# Patient Record
Sex: Female | Born: 1940 | Race: White | Hispanic: No | Smoking: Former smoker
Health system: Southern US, Community
[De-identification: ages and names within clinical notes are randomized; demographics above are authoritative.]

## PROBLEM LIST (undated history)

## (undated) DIAGNOSIS — M1990 Unspecified osteoarthritis, unspecified site: Secondary | ICD-10-CM

## (undated) DIAGNOSIS — R079 Chest pain, unspecified: Secondary | ICD-10-CM

## (undated) DIAGNOSIS — E669 Obesity, unspecified: Secondary | ICD-10-CM

## (undated) DIAGNOSIS — E785 Hyperlipidemia, unspecified: Secondary | ICD-10-CM

## (undated) DIAGNOSIS — I10 Essential (primary) hypertension: Secondary | ICD-10-CM

## (undated) DIAGNOSIS — K219 Gastro-esophageal reflux disease without esophagitis: Secondary | ICD-10-CM

## (undated) DIAGNOSIS — F439 Reaction to severe stress, unspecified: Secondary | ICD-10-CM

## (undated) HISTORY — DX: Reaction to severe stress, unspecified: F43.9

## (undated) HISTORY — DX: Obesity, unspecified: E66.9

## (undated) HISTORY — DX: Chest pain, unspecified: R07.9

## (undated) HISTORY — PX: SHOULDER SURGERY: SHX246

## (undated) HISTORY — PX: ABDOMINAL HYSTERECTOMY: SHX81

---

## 1997-09-21 ENCOUNTER — Ambulatory Visit (HOSPITAL_COMMUNITY): Admission: RE | Admit: 1997-09-21 | Discharge: 1997-09-21 | Payer: Self-pay | Admitting: Obstetrics and Gynecology

## 1998-05-31 ENCOUNTER — Ambulatory Visit (HOSPITAL_COMMUNITY): Admission: RE | Admit: 1998-05-31 | Discharge: 1998-05-31 | Payer: Self-pay | Admitting: Obstetrics and Gynecology

## 1998-12-28 ENCOUNTER — Ambulatory Visit (HOSPITAL_COMMUNITY): Admission: RE | Admit: 1998-12-28 | Discharge: 1998-12-28 | Payer: Self-pay | Admitting: Family Medicine

## 1998-12-28 ENCOUNTER — Encounter: Payer: Self-pay | Admitting: Family Medicine

## 1999-05-16 ENCOUNTER — Other Ambulatory Visit: Admission: RE | Admit: 1999-05-16 | Discharge: 1999-05-16 | Payer: Self-pay | Admitting: Obstetrics & Gynecology

## 1999-05-29 ENCOUNTER — Ambulatory Visit (HOSPITAL_COMMUNITY): Admission: RE | Admit: 1999-05-29 | Discharge: 1999-05-29 | Payer: Self-pay | Admitting: Obstetrics and Gynecology

## 1999-05-29 ENCOUNTER — Encounter: Payer: Self-pay | Admitting: Obstetrics and Gynecology

## 1999-11-09 ENCOUNTER — Ambulatory Visit (HOSPITAL_BASED_OUTPATIENT_CLINIC_OR_DEPARTMENT_OTHER): Admission: RE | Admit: 1999-11-09 | Discharge: 1999-11-09 | Payer: Self-pay | Admitting: Orthopedic Surgery

## 2000-05-14 ENCOUNTER — Encounter: Payer: Self-pay | Admitting: Obstetrics and Gynecology

## 2000-05-14 ENCOUNTER — Ambulatory Visit (HOSPITAL_COMMUNITY): Admission: RE | Admit: 2000-05-14 | Discharge: 2000-05-14 | Payer: Self-pay | Admitting: Obstetrics and Gynecology

## 2000-05-21 ENCOUNTER — Other Ambulatory Visit: Admission: RE | Admit: 2000-05-21 | Discharge: 2000-05-21 | Payer: Self-pay | Admitting: Obstetrics and Gynecology

## 2000-11-22 ENCOUNTER — Encounter: Payer: Self-pay | Admitting: Orthopedic Surgery

## 2000-11-22 ENCOUNTER — Ambulatory Visit (HOSPITAL_COMMUNITY): Admission: RE | Admit: 2000-11-22 | Discharge: 2000-11-22 | Payer: Self-pay | Admitting: Orthopedic Surgery

## 2001-01-01 ENCOUNTER — Encounter: Payer: Self-pay | Admitting: *Deleted

## 2001-01-01 ENCOUNTER — Ambulatory Visit (HOSPITAL_COMMUNITY): Admission: RE | Admit: 2001-01-01 | Discharge: 2001-01-01 | Payer: Self-pay | Admitting: *Deleted

## 2001-01-24 ENCOUNTER — Ambulatory Visit (HOSPITAL_COMMUNITY): Admission: RE | Admit: 2001-01-24 | Discharge: 2001-01-24 | Payer: Self-pay | Admitting: Gastroenterology

## 2001-01-24 ENCOUNTER — Encounter: Payer: Self-pay | Admitting: Gastroenterology

## 2001-03-25 ENCOUNTER — Other Ambulatory Visit: Admission: RE | Admit: 2001-03-25 | Discharge: 2001-03-25 | Payer: Self-pay | Admitting: Obstetrics and Gynecology

## 2001-04-08 ENCOUNTER — Encounter: Payer: Self-pay | Admitting: Obstetrics and Gynecology

## 2001-04-08 ENCOUNTER — Ambulatory Visit (HOSPITAL_COMMUNITY): Admission: RE | Admit: 2001-04-08 | Discharge: 2001-04-08 | Payer: Self-pay | Admitting: Obstetrics and Gynecology

## 2001-12-03 ENCOUNTER — Ambulatory Visit (HOSPITAL_COMMUNITY): Admission: RE | Admit: 2001-12-03 | Discharge: 2001-12-03 | Payer: Self-pay | Admitting: Orthopedic Surgery

## 2001-12-03 ENCOUNTER — Encounter: Payer: Self-pay | Admitting: Orthopedic Surgery

## 2002-04-28 ENCOUNTER — Other Ambulatory Visit: Admission: RE | Admit: 2002-04-28 | Discharge: 2002-04-28 | Payer: Self-pay | Admitting: Obstetrics and Gynecology

## 2002-05-04 ENCOUNTER — Encounter: Payer: Self-pay | Admitting: Obstetrics and Gynecology

## 2002-05-04 ENCOUNTER — Ambulatory Visit (HOSPITAL_COMMUNITY): Admission: RE | Admit: 2002-05-04 | Discharge: 2002-05-04 | Payer: Self-pay | Admitting: Obstetrics and Gynecology

## 2002-05-04 ENCOUNTER — Ambulatory Visit (HOSPITAL_COMMUNITY): Admission: RE | Admit: 2002-05-04 | Discharge: 2002-05-04 | Payer: Self-pay

## 2003-03-05 ENCOUNTER — Emergency Department (HOSPITAL_COMMUNITY): Admission: AD | Admit: 2003-03-05 | Discharge: 2003-03-06 | Payer: Self-pay | Admitting: Emergency Medicine

## 2003-03-06 ENCOUNTER — Encounter: Payer: Self-pay | Admitting: Emergency Medicine

## 2003-06-04 ENCOUNTER — Ambulatory Visit (HOSPITAL_COMMUNITY): Admission: RE | Admit: 2003-06-04 | Discharge: 2003-06-04 | Payer: Self-pay

## 2003-06-10 ENCOUNTER — Ambulatory Visit (HOSPITAL_COMMUNITY): Admission: RE | Admit: 2003-06-10 | Discharge: 2003-06-10 | Payer: Self-pay

## 2004-07-17 ENCOUNTER — Other Ambulatory Visit: Admission: RE | Admit: 2004-07-17 | Discharge: 2004-07-17 | Payer: Self-pay | Admitting: Family Medicine

## 2004-10-09 ENCOUNTER — Encounter: Admission: RE | Admit: 2004-10-09 | Discharge: 2004-10-09 | Payer: Self-pay | Admitting: Family Medicine

## 2004-10-23 ENCOUNTER — Encounter: Admission: RE | Admit: 2004-10-23 | Discharge: 2005-01-21 | Payer: Self-pay

## 2005-03-26 ENCOUNTER — Encounter: Admission: RE | Admit: 2005-03-26 | Discharge: 2005-03-26 | Payer: Self-pay | Admitting: Family Medicine

## 2005-07-13 ENCOUNTER — Encounter: Admission: RE | Admit: 2005-07-13 | Discharge: 2005-07-13 | Payer: Self-pay | Admitting: Family Medicine

## 2005-08-02 ENCOUNTER — Ambulatory Visit: Payer: Self-pay | Admitting: Cardiology

## 2005-08-16 ENCOUNTER — Encounter: Admission: RE | Admit: 2005-08-16 | Discharge: 2005-08-16 | Payer: Self-pay | Admitting: Family Medicine

## 2007-01-20 ENCOUNTER — Inpatient Hospital Stay (HOSPITAL_COMMUNITY): Admission: EM | Admit: 2007-01-20 | Discharge: 2007-01-24 | Payer: Self-pay | Admitting: Emergency Medicine

## 2007-02-14 ENCOUNTER — Encounter: Admission: RE | Admit: 2007-02-14 | Discharge: 2007-02-14 | Payer: Self-pay | Admitting: Orthopedic Surgery

## 2007-02-24 ENCOUNTER — Encounter: Admission: RE | Admit: 2007-02-24 | Discharge: 2007-02-24 | Payer: Self-pay | Admitting: Orthopedic Surgery

## 2007-02-26 ENCOUNTER — Encounter: Admission: RE | Admit: 2007-02-26 | Discharge: 2007-02-26 | Payer: Self-pay | Admitting: Orthopedic Surgery

## 2007-03-11 ENCOUNTER — Encounter: Admission: RE | Admit: 2007-03-11 | Discharge: 2007-03-11 | Payer: Self-pay | Admitting: Radiology

## 2007-06-18 ENCOUNTER — Emergency Department (HOSPITAL_COMMUNITY): Admission: EM | Admit: 2007-06-18 | Discharge: 2007-06-18 | Payer: Self-pay | Admitting: Emergency Medicine

## 2007-07-09 ENCOUNTER — Ambulatory Visit: Payer: Self-pay | Admitting: Cardiology

## 2007-07-21 ENCOUNTER — Ambulatory Visit: Payer: Self-pay

## 2007-11-05 ENCOUNTER — Encounter: Admission: RE | Admit: 2007-11-05 | Discharge: 2007-11-05 | Payer: Self-pay | Admitting: Family Medicine

## 2008-07-30 IMAGING — CR DG HUMERUS 2V *R*
2 series · 2 of 2 positions shown · non-contrast
Comparison: none

CLINICAL DATA: Fall with right shoulder and right arm pain. 
RIGHT SHOULDER ? 2 VIEW:

[view not recorded (1 of 2)]
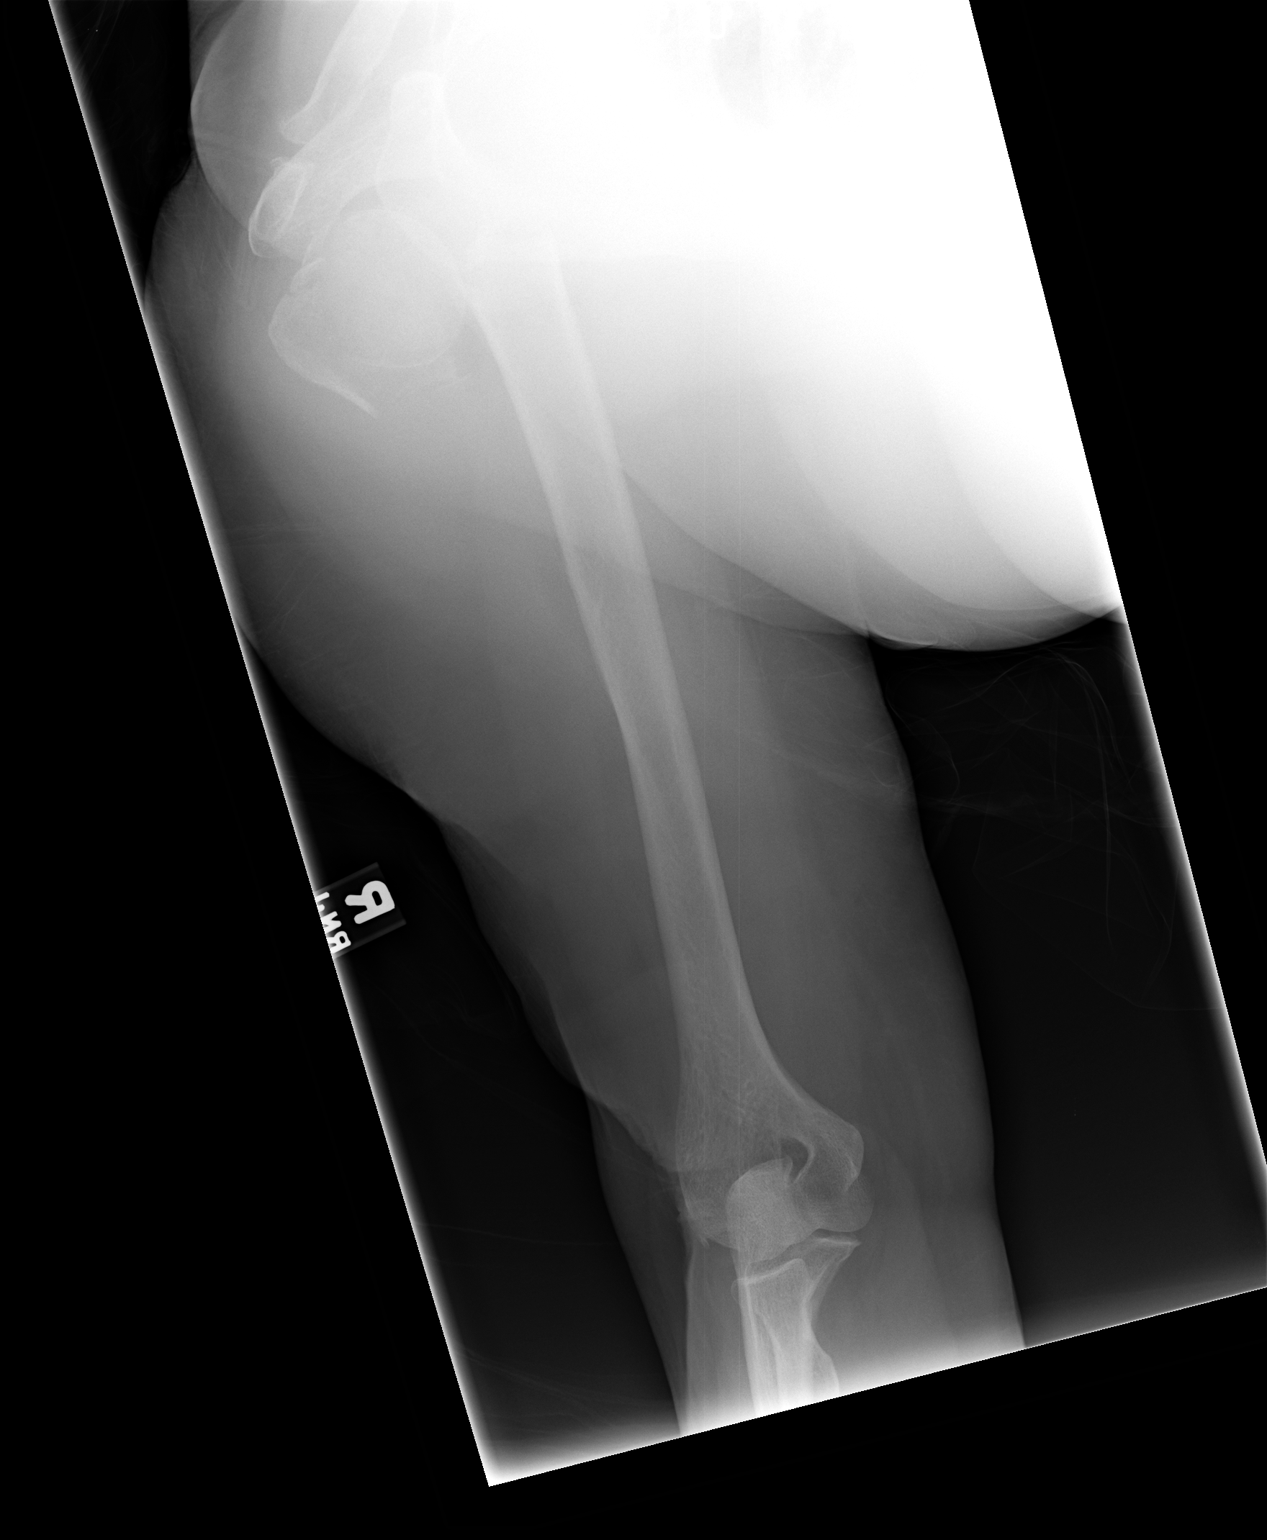

[view not recorded (2 of 2)]
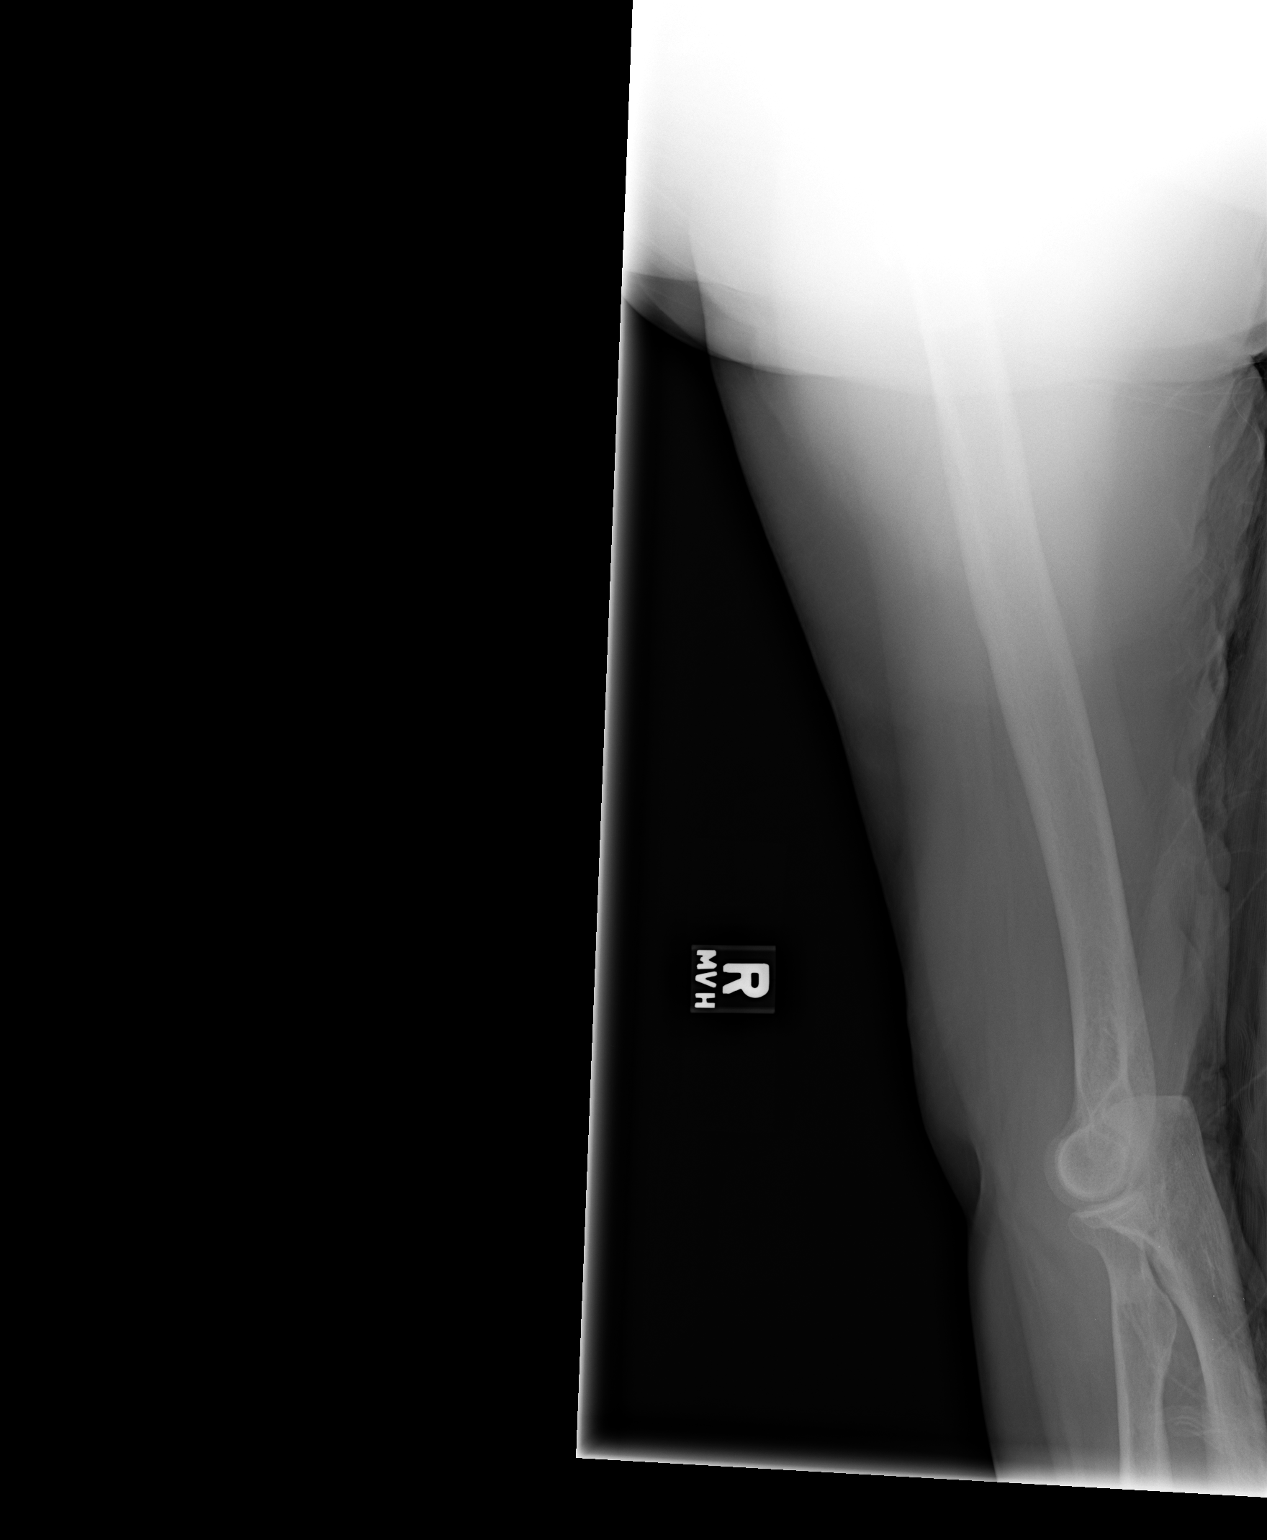

[2 of 2 positions shown; findings below may reference images not displayed]

FINDINGS: There is a 1-shaft width displaced humeral neck fracture with the distal humerus displaced inferomedially.   A fracture line does extend into the humeral head. Mild degenerative changes of the AC joint are noted.   There is no evidence of dislocation of the humeral head.
IMPRESSION: Displaced humeral neck fracture with fracture involving the humeral head.  No evidence of dislocation. 
RIGHT HUMERUS ? 2 VIEW:
FINDINGS: There is a 1-shaft width displaced humeral neck fracture with the distal humerus displaced inferomedially.   A fracture line does extend into the humeral head. Mild degenerative changes of the AC joint are noted.   There is no evidence of dislocation of the humeral head.
IMPRESSION: Displaced humeral neck fracture with fracture involving the humeral head.  No evidence of dislocation

## 2011-01-02 NOTE — Op Note (Signed)
NAMEMONNICA, SALTSMAN NO.:  [id number]   MEDICAL RECORD NO.:  [id number]          PATIENT TYPE:  INP   LOCATION:  1531                         FACILITY:  [facility]   PHYSICIAN:  Deidre Ala, M.D.    DATE OF BIRTH:  1941-05-06   DATE OF PROCEDURE:  01/22/2007  DATE OF DISCHARGE:                               OPERATIVE REPORT   PREOPERATIVE DIAGNOSIS:  Completely displaced right proximal humerus  fracture surgical neck with nondisplaced greater tuberosity fracture.   POSTOPERATIVE DIAGNOSIS:  Completely displaced right proximal humerus  fracture surgical neck with nondisplaced greater tuberosity fracture.   PROCEDURE:  Open reduction and internal fixation of right proximal  humerus displaced closed fracture with nondisplaced tuberosity fracture.   SURGEON:  1. Charlesetta Shanks, M.D.   ASSISTANT:  Phineas Semen, P.A.-C.   ANESTHESIA:  General endotracheal.   CULTURES:  None.   DRAINS:  None.   ESTIMATED BLOOD LOSS:  150 mL, replacement none.   PATHOLOGIC FINDINGS AND HISTORY:  [name] is an overweight 70 year old  female who fell sustaining this injury.  She had preoperative clearance  by Dr. Charlies Constable and, at surgery, we did a closed reduction getting a  very good initial reduction.  We then used the SNP humeral plating  system of [employer] with five smooth proximal locking pegs and  three unicortical pegs distally for the intermedullary component of the  plate.  Anatomic reduction was obtained on all views with fluoro,  intraoperative fluoro was used, with satisfactory position and  alignment.   DESCRIPTION OF PROCEDURE:  With adequate anesthesia obtained using  endotracheal technique, 1 gram Ancef given IV prophylaxis, the patient  is placed in the supine beach chair position.  She is rather large and  so it took a long time to get appropriate padding, positioning, and C-  arm positioning to get two views of the humeral head and neck.  We then  did a closed reduction maneuver and obtained a very good initial  reduction.  After standard prepping and draping, an incision was then  made lateral to the deltopectoral interval distal at the fracture level  as determined by the fluoroscopy.  Dissection was carried down to the  fracture site and retractors were placed.  I then rongeured the lateral  cortex just lateral to the intertubercular groove.  I then placed the  plate and a good position was obtained.  Two K-wires were then placed in  the humeral head to stabilize it through the appropriate holes for this  and bent to act as retractors.  I then sequentially placed the pegs with  a constellation of diverging peg throughout, adjusting length on one  because the fluoro position did show it a little bit long, but then all  screw pegs were in their appropriate position and alignment in the  subchondral bone plate, not penetrating on all views with fluoro.  Then,  I used the guide to place the three unicortical screws locking into the  plate.  The fracture was then stabilized including the greater  tuberosity.  This was a three part fracture and  there was other small  comminution present, but the overall alignment was excellent.  Irrigation was carried out. The wound was then closed in layers, on the  deltopectoral interval with 0 Vicryl running, the subcu with running 3-0  Vicryl, and the skin with staples.  A bulky sterile compressive dressing  was applied with a sling.  The patient, having tolerated the procedure  well, was awakened and taken to the recovery room in satisfactory  condition for routine postoperative care and follow-up.           ______________________________  V. Charlesetta Shanks, M.D.     VEP/MEDQ  D:  01/22/2007  T:  01/22/2007  Job:  [id number]   cc:   Hillery Aldo, M.D.   Talmadge Coventry, M.D.  Fax: [phone]   Everardo Beals. Juanda Chance, MD, [facility]  [address]  [place], [place] [address]   Lubertha Basque. Jerl Santos,  M.D.  Fax: [phone]

## 2011-01-02 NOTE — Consult Note (Signed)
[name], [name] NO.:  [id number]   MEDICAL RECORD NO.:  [id number]          PATIENT TYPE:  INP   LOCATION:  0103                         FACILITY:  [facility]   PHYSICIAN:  [name], M.D.   DATE OF BIRTH:  [birth year]   DATE OF CONSULTATION:  01/21/2007  DATE OF DISCHARGE:                                 CONSULTATION   PRIMARY CARE PHYSICIAN:  [name], M.D.   CARDIOLOGISTEverardo Beals Juanda Chance, MD, [facility]   ORTHOPEDIC SURGEON:  Nicole Basque. Jerl Edwards, M.D.   REASON FOR CONSULTATION:  Questionable inferior infarct based on the EKG  reading.   HISTORY OF PRESENT ILLNESS:  The patient is a 70 year old female with a  past medical history of hypertension and dyslipidemia who presented to  the emergency department after falling today and sustaining an injury to  the right shoulder.  Radiographs did show a fracture involving the right  shoulder, and therefore she was admitted by Dr. Jerl Edwards for surgical  repair.  A routine EKG done in the emergency department shows  questionable Q waves in the inferior leads, suggestive of a prior  inferior MI.  The patient denies any past medical history of chest pain,  anginal-type symptoms, shortness of breath, or other worrisome symptoms.  She also reports that she has had a negative stress test done by Dr.  Juanda Edwards within the last year or so.  We are asked to help provide  direction with regard to whether the patient needs further cardiac  evaluation prior to her surgery.   PAST MEDICAL HISTORY:  1. Gastroesophageal reflux disease with history of esophageal      stricture.  2. Hypertension.  3. Hyperlipidemia.  4. Osteoarthritis.  5. Hiatal hernia.  6. Status post hysterectomy.   FAMILY HISTORY:  The patient's mother died in her 19s secondary to  complications of congestive heart failure.  She also had hypertension  and hyperlipidemia.  The patient's father died at age 11 from an acute  MI.  He was diabetic.  The  patient has one sister who is alive in her  36s and is reportedly healthy.  She has one brother who is a live and is  50 and is also healthy.   SOCIAL HISTORY:  The patient is married and lives with her husband in  [place].  She has a remote history of tobacco use but quit in her  early 56s.  She denies any alcohol use.  She currently works as a Comptroller  for the elderly but has been a longstanding [employer] employee up until she  retired at age 77.   CURRENT MEDICATIONS:  The patient does not know the dosages.  1. Crestor.  2. Toprol-XL.  3. Benazepril.  4. Fish oil.  5. Protonix.  6. Lasix.  7. Tylenol.   ALLERGIES:  PROPOXYPHENE.   REVIEW OF SYSTEMS:  The patient reports that her energy level varies.  She has not gained or lost any significant amount of weight.  She denies  chest pain, shortness of breath, or other signs of angina.  She has an  occasional cough which  she attributes to her allergies.  No changes in  bowel habits, melena or hematochezia.  No nausea or vomiting.   DATA REVIEW:  Right shoulder films do show a proximal humeral fracture.   LABORATORY DATA:  White blood cell count is 11.9, hemoglobin 12.9,  hematocrit 38.9, platelets 244 with an absolute neutrophil count of  10.6.  Sodium is 139, potassium 4.0, chloride 101, bicarb 27, BUN 16,  creatinine 0.72, glucose 142.   ASSESSMENT AND PLAN:  Questionable inferior infarct based on  electrocardiogram readings:  A review of the 12-lead electrocardiogram  does show a question of Q waves in leads 2, 3, and aVF, but there is  significant artifact on this tracing so there is doubt that these are of  any clinical significance.  Given that the patient has had a negative  stress test done by Dr. Juanda Edwards within the last year and that she does  not have any symptoms suggestive of angina, I would say that the  likelihood that she has suffered an inferior myocardial infarction in  the past is very well.  Her revised [name]  cardiac risks based on her  known past medical history is 0.4%.  I would therefore proceed with  surgery with no further cardiac workup.  I would continue the patient's  perioperative beta-blocker sheath.  I would hold her benazepril and  Lasix due to fluid shifts during surgery and the risk of renal  insufficiency postoperatively and resume these 24-48 hours  postoperatively, assuming she recuperates well.  Would make sure she is  on deep venous thrombosis prophylaxis.   Thank you for this consultation.  We will follow the patient with you.      [name], M.D.  Electronically Signed     CR/MEDQ  D:  01/20/2007  T:  01/21/2007  Job:  [id number]   cc:   [name], M.D.  Fax: [phone]   Nicole Beals. Juanda Chance, MD, [facility]  [address]  [place], [place] [address]   Nicole Basque. Jerl Edwards, M.D.  Fax: [phone]

## 2011-01-02 NOTE — Assessment & Plan Note (Signed)
[facility]                            CARDIOLOGY OFFICE NOTE   JOHNNETTE, LAUX                    MRN:          [id number]  DATE:07/09/2007                            DOB:          01/04/1941    PRIMARY CARE PHYSICIAN:  Talmadge Coventry, M.D.   REFERRING PHYSICIAN:  Talmadge Coventry, M.D. and Cathren Laine, M.D.,  from the [facility] ED.   CHIEF COMPLAINT:  Chest pain.   CLINICAL HISTORY:  Ms. Buenaventura is 70 years old and was referred for  evaluation of chest pain.  She has been having substernal chest  discomfort which has been nonexertional for a few weeks and this became  worse and she called Dr. Stephannie Peters office and she advised her to go to  the emergency department.  She went to the emergency department via EMS  and was evaluated, all her markers were negative, her electrocardiogram  was okay, and she was discharged home and arrangements were made for her  to be seen in consultation here today.  She has not had very much in the  way of chest discomfort since her discharge from the emergency  department.   She has been under a great deal of stress.  She had a fractured shoulder  and medical bills of $30,000 that she is struggling to pay for.  She  just found out that she will be taking care of her 87 year old  grandchild.  The grandchild was living with her father.  This is the  daughter of Ms. Knippel's daughter who has a drug problem and was deemed  not fit to care for the child.   PAST MEDICAL HISTORY:  Significant for hypertension and hyperlipidemia.  She also has GERD and osteoarthritis.   CURRENT MEDICATIONS:  Include Lasix, K-Dur, Vicodin, Lovaza, aspirin,  simvastatin, Toprol-XL, and benazepril.   SOCIAL HISTORY:  She is married.  She lost two previous husbands by  cancer and I believe this is her fourth marriage.  She has a total of  four children by her first husband and one child by her second-to-last  husband who  is the daughter whose granddaughter will be staying with  her.  She was working part-time sitting with elderly people but has not  worked since she had her injury and fracture of her humerus.   FAMILY HISTORY:  Positive for heart disease.  Her father died of a heart  attack at age 72 and her mother died at age 27 of congestive heart  failure.   REVIEW OF SYSTEMS:  Positive for symptoms of stress and fatigue.   EXAMINATION:  The blood pressure was 150/80 and the pulse 93 and  regular.  There was no venous distention.  The carotid pulses were full  and there were no bruits.  The chest was clear without rales or rhonchi.  The cardiac rhythm was regular.  I could hear no murmurs or gallops.  The abdomen was soft with normal bowel sounds.  The abdomen was  protuberant.  There was no hepatosplenomegaly.  Peripheral pulses were  full and there was no peripheral edema.  An electrocardiogram showed sinus rhythm at 93 and possible old inferior  infarction.   IMPRESSION:  1. Chest pain, rule out ischemic heart disease.  2. Hypertension.  3. Hyperlipidemia.  4. Situational stress.  5. Status post surgery for a fractured humerus June 2008.   RECOMMENDATIONS:  Ms. Lewison symptoms are somewhat atypical for  ischemia but her risk profile is fairly high.  I think we should  evaluate her further and we have arranged for her to have a rest/stress  adenosine Myoview scan.  Her blood pressure and pulse are also somewhat  up and will increase her Toprol-XL from 100 to 150 a day.  If her scan  does not show ischemia then I would attribute many of her symptoms to  stress.  Dr. Smith Mince is following her for her other medical problems  and she plans follow up with her later.  We will be in touch with her  after we get the results of her scan.     Bruce Elvera Lennox Juanda Chance, MD, [facility]  Electronically Signed    BRB/MedQ  DD: 07/09/2007  DT: 07/09/2007  Job #: [id number]

## 2011-01-02 NOTE — Discharge Summary (Signed)
[name], [name] NO.:  [id number]   MEDICAL RECORD NO.:  [id number]          PATIENT TYPE:  INP   LOCATION:  1531                         FACILITY:  [facility]   PHYSICIAN:  Deidre Ala, M.D.    DATE OF BIRTH:  Mar 03, 1941   DATE OF ADMISSION:  01/20/2007  DATE OF DISCHARGE:  01/24/2007                               DISCHARGE SUMMARY   FINAL DIAGNOSES:  1. Right humeral surgical neck fracture.  2. Gastroesophageal reflux.  3. Hiatal hernia.  4. Hypertension.  5. Morbid obesity.  6. Osteoarthritis.  7. Hyperlipidemia.   HISTORY:  This is a 70 year old white female who apparently had fallen  the other day hitting her right side.   PAST MEDICAL HISTORY:  1. Hypertension.  2. Dyslipidemia.   She presented to the Emergency Room on January 20, 2007.  A workup was done  which showed a surgical neck fracture of right humerus.  Subsequently,  initially seen by Dr. Jerl Santos and admitted and then transferred to Dr.  Ollen Gross service.   HOSPITAL COURSE:  At admission the patient was noted to have the  surgical neck fracture; subsequently, she was scheduled for surgery.  On  January 22, 2007, she underwent ORIF of the right surgical neck of the  humerus.  She tolerated procedure well, no intraoperative complications  occurred.  Postoperatively, the patient's recovery was excellent.  No  untoward events occurred during her stay.  She did have some mild acute  blood loss anemia postoperatively, this was stable at the time of  discharge with the hemoglobin being 9.1 and hematocrit 27.8.  Overall,  she is doing well.  The surgical incision was healing satisfactorily.  She had the staples sutured on the right upper arm.  No sign of  infection was noted at the time of discharge.  Peripheral pulses intact,  neuro is grossly intact.  She was ready for discharge on January 24, 2007.  Only complaint was not having a bowel movement.  Prior to her discharge,  she had a bowel movement.  She  was given laxative of choice to  accomplish this.   At the time of discharge the patient's medications were:  1. Crestor daily.  2. Toprol-XL daily.  3. Benazepril.  4. Fish Oil.  5. Protonix.  6. Lasix.  7. Vicodin 1-2 p.o. q.4-6h. p.r.n. for pain.   Overall, patient's recovered without difficulty.  She was discharged  home in satisfactory and stable condition on January 24, 2007.  She is to  followup with Dr. Renae Fickle in 10 days.      Phineas Semen, P.A.    ______________________________  Seth [place]. Charlesetta Shanks, M.D.    CL/MEDQ  D:  01/24/2007  T:  01/24/2007  Job:  [id number]

## 2011-01-05 NOTE — Procedures (Signed)
[place]  Patient:    [name], [name]                      MRN: [id number] Proc. Date: 01/24/01 Attending:  Verlin Grills, M.D. CC:         Jethro Bastos, M.D.                           Procedure Report  PROCEDURE:  Esophagogastroduodenoscopy with Savary esophageal dilation.  ENDOSCOPIST:  Verlin Grills, M.D.  INDICATIONS:  [name]. 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  She is experiencing dysphagia. She is scheduled for esophageal dilation.  PREMEDICATION:  Versed 10 mg, fentanyl 100 mcg.  ENDOSCOPE:  Olympus gastroscope, 16 mm Savary dilator.  DESCRIPTION OF PROCEDURE:  After obtaining informed consent, [name] was placed in the left lateral decubitus position on the fluoroscopy table.  I administered intravenous fentanyl and intravenous Versed to achieve conscious sedation for the procedure.  The patients blood pressure, oxygen saturation and cardiac rhythm were monitored throughout the procedure and documented in the medical record.  The Olympus gastroscope was passed through the posterior hypopharynx into the proximal esophagus without difficulty.  The hypopharynx, larynx and vocal cords were normal.  Esophagoscopy:  The proximal and mid segments of the esophagus appeared normal.  There is a benign peptic stricture at the esophagogastric junction. There is no endoscopic evidence for the presence of erosive esophagitis, Barretts esophagus or esophageal ulceration.  Gastroscopy:  Retroflexed view of the gastric cardia and fundus was normal. The gastric body, antrum and pylorus appeared normal.  Duodenoscopy:  The duodenal bulb and descending duodenum appeared normal.  Savary esophageal dilation:  The Savary dilator wire was passed through the endoscope and tip of the  guidewire advanced to the distal gastric antrum as confirmed endoscopically and fluoroscopically.  Under fluoroscopic guidance, the 16 mm Savary dilator passed without resistance.  Repeat esophagogastroscopy post esophageal dilation revealed satisfactory dilation of the benign peptic stricture at the esophagogastric junction, and no endoscopic evidence present of trauma due to the guidewire.  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  RECOMMENDATIONS: 1. Continue proton pump inhibitor therapy to prevent heartburn. 2. Repeat esophageal dilation as needed. DD:  01/24/01 TD:  01/24/01 Job: [id number]

## 2011-01-05 NOTE — Op Note (Signed)
[place]  Patient:    [name], [name]                   MRN: [id number] Proc. Date: 11/09/99 Attending:  Jearld Adjutant, M.D. CC:         Jearld Adjutant, M.D.                           Operative Report  PREOPERATIVE DIAGNOSES: 1. Right fourth hammertoe. 2. Right fifth hard corn dorsolateral DIP joint. 3. Right fifth hammertoe. 4. Right fifth bunionette at metatarsal head. 5. Left fourth hammertoe. 6. Left fifth DIP dorsolateral corn - hard corn. 7. Left fifth hammertoe. 8. Left fifth bunionette.  POSTOPERATIVE DIAGNOSES: 1. Right fourth hammertoe. 2. Right fifth hard corn dorsolateral DIP joint. 3. Right fifth hammertoe. 4. Right fifth bunionette at metatarsal head. 5. Left fourth hammertoe. 6. Left fifth DIP dorsolateral corn - hard corn. 7. Left fifth hammertoe. 8. Left fifth bunionette.  PROCEDURE: 1. Right fourth DuVries hammertoe correction. 2. Right fifth DIP joint partial phalangeal base excision. 3. Right fifth DuVries hammertoe correction at PIP joint. 4. Right fifth bunionette excision. 5. Left fourth DuVries PIP hammertoe correction. 6. Left fourth phalangeal base partial _________ , DIP joint. 7. Left fifth DuVries hammertoe PIP correction. 8. Left fifth bunionette excision.  SURGEON:  Jearld Adjutant, M.D.  ASSISTANT:  Currie Paris. Thedore Mins.  ANESTHESIA:  General endotracheal.  CULTURES:  None.  DRAINS:  None.  ESTIMATED BLOOD LOSS:  Minimal.  TOURNIQUET TIME:  Right, 51 minutes, left 50 minutes.  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  She had good circulation of the feet.  She was miserable with her shoes, very painful with _________ over the corns with hammertoes and also painful bunionettes bilaterally. These were rubbing, hurting, producing callus, and she wanted corrections done s well  as excision of the corn areas and straightening of the fourth and fifth toes and the excision of the bunionettes.  Therefore this is what we did with good corrections all obtained with removal of the bony hard condyle producing the fifth toe corns bilaterally.  The DuVries worked well to straighten the curled toes with joint resection as will be listed and the bunionette excisions decompressed the  fifth metatarsal heads bilaterally.  PROCEDURE:  With adequate anesthesia obtained using endotracheal technique, 1 g  Ancef given, IV prophylaxis, the patient was placed in the supine position. Both feet were prepped from the toes to the knee in the standard fashion.  After standard prepping and draping, we started on the right side, where Esmarch sanguinous was used and the Esmarch left on above the ankle.  The tourniquet was left at 350 mmHg.  I first ellipsed out the corn soft tissue over the right fifth toe DIP joint, dorsolateral, excised the soft tissue corn, then dissected down o the phalangeal base and removed it with a Rongeurs smoothing it.  This was irrigated and then closed with 4-0 nylon, everting the skin.  I then turned attention to the right fifth bunionette, where a curvilinear dorsal incision was made over the metatarsal head.  Dissection was carried down to the capsule which was taken as a flap based distal.  I then exposed the exostosis of the bunionette and removed it with a saw and oscillating rasp.  We then repaired the capsule over top and the skin with 4-0 nylon.  I then turned attention to the right fourth toe where a dorsal wedge of skin was taken out over the PIP joint, the extensor tendon. The joint was then resected in V-shaped fashion with a Rongeur and the flexor tendon released.  I also did a tenotomy on the extensor tendon to keep it from cocking up at the MPP joint.  We then sutured this back, straightening the PIP joint over 2-0 flow bolsters in  the manner of DuVries and we straightened these  toes from their excessive curling and sutured this with vertical mattress suture of 4-0 nylon and sutures on either side of the PIP joint skin resection also.  I was careful to not leave any dog ears on the skin so that no scar tissue would form, producing additional corns.  I then performed the exact same procedure on the fifth toe on the right.  At this point all wounds were then closed with 4-0 nylon.  A  bulky sterile compressive forefoot dressing was done and placed on and the tourniquet let down with the toes pinking up nicely.  I then used Esmarch for sanguination on the left foot, Esmarch above the ankle, and performed the exact  same procedures as listed on the right with the corn excision left fifth DIP, the bunionette left fifth metatarsal head, and the DuVries on the left fourth and fifth PIP joints with straightening over the bolsters and an extensor tenotomy of 4.  Similar dressing was applied.  Toes pinked up well.  The patient, having tolerated the procedure well, was awake and taken to the recovery room in satisfactory condition to be discharged to outpatient routine, crutches or walker, weight-bearing as tolerated on wooden sole shoes on her heel, elevation, Tylox or pain.  We told her to call the office for recheck on Monday.  That is in four days from now.  I also did trim her toenails. DD:  11/09/99 TD:  11/09/99 Job: [id number]

## 2011-05-30 LAB — I-STAT 8, (EC8 V) (CONVERTED LAB)
Acid-Base Excess: 3 — ABNORMAL HIGH
BUN: 26 — ABNORMAL HIGH
Bicarbonate: 27.4 — ABNORMAL HIGH
Chloride: 110
Glucose, Bld: 163 — ABNORMAL HIGH
HCT: 47 — ABNORMAL HIGH
Hemoglobin: 16 — ABNORMAL HIGH
Operator id: 198171
Potassium: 5.1
Sodium: 138
TCO2: 29
pCO2, Ven: 41.3 — ABNORMAL LOW
pH, Ven: 7.43 — ABNORMAL HIGH

## 2011-05-30 LAB — POCT I-STAT CREATININE
Creatinine, Ser: 0.8
Operator id: 198171

## 2011-05-30 LAB — POCT CARDIAC MARKERS
CKMB, poc: 1.8
Myoglobin, poc: 79.5
Operator id: 198171
Troponin i, poc: <0.05

## 2011-06-07 LAB — URINALYSIS, ROUTINE W REFLEX MICROSCOPIC
Bilirubin Urine: NEGATIVE
Glucose, UA: NEGATIVE
Hgb urine dipstick: NEGATIVE
Ketones, ur: NEGATIVE
Nitrite: NEGATIVE
Protein, ur: NEGATIVE
Specific Gravity, Urine: 1.015
Urobilinogen, UA: 0.2
pH: 6

## 2011-06-07 LAB — CBC
HCT: 27.8 — ABNORMAL LOW
HCT: 38.9
Hemoglobin: 12.9
Hemoglobin: 9.6 — ABNORMAL LOW
MCHC: 32.9
MCHC: 33.2
MCHC: 33.6
MCV: 91.6
MCV: 91.6
MCV: 93.5
Platelets: 244
RBC: 2.97 — ABNORMAL LOW
RBC: 3.13 — ABNORMAL LOW
RBC: 4.25
RDW: 13.7
WBC: 11.9 — ABNORMAL HIGH
WBC: 8.6
WBC: 9.1

## 2011-06-07 LAB — BASIC METABOLIC PANEL
BUN: 16
CO2: 27
Calcium: 9.2
Chloride: 101
Creatinine, Ser: 0.72
GFR calc Af Amer: >60
GFR calc non Af Amer: >60
Glucose, Bld: 142 — ABNORMAL HIGH
Potassium: 4
Sodium: 139

## 2011-06-07 LAB — PROTIME-INR
INR: 1.1
Prothrombin Time: 14.8

## 2011-06-07 LAB — DIFFERENTIAL
Basophils Absolute: 0
Basophils Relative: 0
Eosinophils Absolute: 0
Eosinophils Relative: 0
Lymphocytes Relative: 9 — ABNORMAL LOW
Lymphs Abs: 1.1
Monocytes Absolute: 0.2
Monocytes Relative: 1 — ABNORMAL LOW
Neutro Abs: 10.6 — ABNORMAL HIGH
Neutrophils Relative %: 89 — ABNORMAL HIGH

## 2011-07-14 ENCOUNTER — Encounter: Payer: Self-pay | Admitting: Emergency Medicine

## 2011-07-14 ENCOUNTER — Other Ambulatory Visit: Payer: Self-pay

## 2011-07-14 ENCOUNTER — Emergency Department (HOSPITAL_COMMUNITY)
Admission: EM | Admit: 2011-07-14 | Discharge: 2011-07-14 | Disposition: A | Discharge status: Home / Self Care | Payer: Medicare Other | Attending: Emergency Medicine | Admitting: Emergency Medicine

## 2011-07-14 ENCOUNTER — Emergency Department (HOSPITAL_COMMUNITY): Payer: Medicare Other

## 2011-07-14 DIAGNOSIS — Z8739 Personal history of other diseases of the musculoskeletal system and connective tissue: Secondary | ICD-10-CM | POA: Insufficient documentation

## 2011-07-14 DIAGNOSIS — R079 Chest pain, unspecified: Secondary | ICD-10-CM | POA: Insufficient documentation

## 2011-07-14 DIAGNOSIS — Z79899 Other long term (current) drug therapy: Secondary | ICD-10-CM | POA: Insufficient documentation

## 2011-07-14 DIAGNOSIS — R1310 Dysphagia, unspecified: Secondary | ICD-10-CM | POA: Insufficient documentation

## 2011-07-14 DIAGNOSIS — K219 Gastro-esophageal reflux disease without esophagitis: Secondary | ICD-10-CM | POA: Insufficient documentation

## 2011-07-14 DIAGNOSIS — E785 Hyperlipidemia, unspecified: Secondary | ICD-10-CM | POA: Insufficient documentation

## 2011-07-14 DIAGNOSIS — E119 Type 2 diabetes mellitus without complications: Secondary | ICD-10-CM | POA: Insufficient documentation

## 2011-07-14 DIAGNOSIS — I10 Essential (primary) hypertension: Secondary | ICD-10-CM | POA: Insufficient documentation

## 2011-07-14 HISTORY — DX: Gastro-esophageal reflux disease without esophagitis: K21.9

## 2011-07-14 HISTORY — DX: Hyperlipidemia, unspecified: E78.5

## 2011-07-14 HISTORY — DX: Essential (primary) hypertension: I10

## 2011-07-14 HISTORY — DX: Unspecified osteoarthritis, unspecified site: M19.90

## 2011-07-14 NOTE — ED Provider Notes (Signed)
History     CSN: [id number] Arrival date & time: 07/14/2011  1:38 PM   First MD Initiated Contact with Patient 07/14/11 1433      Chief Complaint  Patient presents with  . Hypertension  . Chest Pain    due to coughing when she got choked    (Consider location/radiation/quality/duration/timing/severity/associated sxs/prior treatment) Patient is a 70 y.o. female presenting with chest pain. The history is provided by the patient.  Chest Pain The chest pain began 3 - 5 hours ago. Episode Length: She took a pill this morning that she feels became lodged in her throat or upper chest. She felt like she was choking, and continued to drink water until the pill dissolved. She complained of chest pain with the coughing episode, but no further pain now.  Progression since onset: She reports that she became concerned about her blood pressure when it was found to be high by EMS.  Pertinent negatives for primary symptoms include no fever, no syncope, no shortness of breath, no cough and no abdominal pain.     Past Medical History  Diagnosis Date  . Hypertension   . GERD (gastroesophageal reflux disease)   . Diabetes mellitus   . Hyperlipidemia   . Arthritis     No past surgical history on file.  No family history on file.  History  Substance Use Topics  . Smoking status: Not on file  . Smokeless tobacco: Not on file  . Alcohol Use: No    OB History    Grav Para Term Preterm Abortions TAB SAB Ect Mult Living                  Review of Systems  Constitutional: Negative for fever and chills.  HENT:       See HPI.  Respiratory: Negative.  Negative for cough and shortness of breath.   Cardiovascular: Positive for chest pain. Negative for syncope.       See HPI.  Gastrointestinal: Negative.  Negative for abdominal pain.  Musculoskeletal: Negative.   Skin: Negative.   Neurological: Negative.     Allergies  Review of patient's allergies indicates no known allergies.  Home  Medications   Current Outpatient Rx  Name Route Sig Dispense Refill  . BENAZEPRIL HCL 40 MG PO TABS Oral Take 40 mg by mouth daily.      Marland Kitchen VITAMIN D 1000 UNITS PO TABS Oral Take 1,000 Units by mouth daily.      Marland Kitchen FLUTICASONE PROPIONATE 50 MCG/ACT NA SUSP Nasal Place 2 sprays into the nose 2 (two) times daily as needed. allergies     . FUROSEMIDE 20 MG PO TABS Oral Take 20 mg by mouth 2 (two) times daily. fluid    . GLIMEPIRIDE 4 MG PO TABS Oral Take 4 mg by mouth daily before breakfast.      . METOPROLOL SUCCINATE 100 MG PO TB24 Oral Take 150 mg by mouth daily. Pt takes 1.5 tab ( 150mg  dose)    . OMEPRAZOLE 20 MG PO CPDR Oral Take 20 mg by mouth 2 (two) times daily.      Marland Kitchen POTASSIUM CHLORIDE CRYS CR 20 MEQ PO TBCR Oral Take 20 mEq by mouth 2 (two) times daily.      Marland Kitchen SIMVASTATIN 40 MG PO TABS Oral Take 40 mg by mouth at bedtime.      . SULFAMETHOXAZOLE-TMP DS 800-160 MG PO TABS Oral Take 1 tablet by mouth 2 (two) times daily. Filled 07-05-11 for 10  day supply. Pt is on day 6th of therapy      BP 158/76  Pulse 102  Temp(Src) 97.6 F (36.4 C) (Oral)  Resp 18  Ht 5\' 5"  (1.651 m)  Wt 212 lb (96.163 kg)  BMI 35.28 kg/m2  SpO2 98%  Physical Exam  Constitutional: She appears well-developed and well-nourished.  HENT:  Head: Normocephalic.  Neck: Normal range of motion. Neck supple.  Cardiovascular: Normal rate and regular rhythm.   Pulmonary/Chest: Effort normal and breath sounds normal. She exhibits no tenderness.  Abdominal: Soft. Bowel sounds are normal. There is no tenderness. There is no rebound and no guarding.  Musculoskeletal: Normal range of motion.  Neurological: She is alert. No cranial nerve deficit.  Skin: Skin is warm and dry. No rash noted.  Psychiatric: She has a normal mood and affect.    ED Course  Procedures (including critical care time)  Labs Reviewed - No data to display No results found.   No diagnosis found.    MDM  The patient is eating and  drinking without difficulty. Chest x-ray is normal. Her blood pressure is now significantly lower with symptoms of lightheadedness and mild nausea. She reports she hasn't eaten or checked her blood sugar. Will give meal and check CBG and re-evaluate.  Patient feeling better, CBG 139. States ready for discharge home.        Rodena Medin, PA 07/14/11 1726

## 2011-07-14 NOTE — ED Provider Notes (Signed)
Medical screening examination/treatment/procedure(s) were performed by non-physician practitioner and as supervising physician I was immediately available for consultation/collaboration.   Geoffery Lyons, MD 07/14/11 2030

## 2011-07-14 NOTE — ED Notes (Signed)
NWG:[id number]<ZH> Expected date:07/14/11<BR> Expected time: 1:16 PM<BR> Means of arrival:Ambulance<BR> Comments:<BR> EMS 5 RK  70 yof Chest wall pain after coughing  ETA 15

## 2012-03-30 ENCOUNTER — Observation Stay (HOSPITAL_COMMUNITY)
Admission: EM | Admit: 2012-03-30 | Discharge: 2012-03-31 | Disposition: A | Discharge status: Home / Self Care | Payer: Medicare Other | Attending: Internal Medicine | Admitting: Internal Medicine

## 2012-03-30 ENCOUNTER — Encounter (HOSPITAL_COMMUNITY): Payer: Self-pay | Admitting: Emergency Medicine

## 2012-03-30 ENCOUNTER — Emergency Department (HOSPITAL_COMMUNITY): Payer: Medicare Other

## 2012-03-30 DIAGNOSIS — R739 Hyperglycemia, unspecified: Secondary | ICD-10-CM

## 2012-03-30 DIAGNOSIS — E118 Type 2 diabetes mellitus with unspecified complications: Secondary | ICD-10-CM

## 2012-03-30 DIAGNOSIS — E669 Obesity, unspecified: Secondary | ICD-10-CM | POA: Insufficient documentation

## 2012-03-30 DIAGNOSIS — E785 Hyperlipidemia, unspecified: Secondary | ICD-10-CM | POA: Diagnosis present

## 2012-03-30 DIAGNOSIS — M1990 Unspecified osteoarthritis, unspecified site: Secondary | ICD-10-CM | POA: Diagnosis present

## 2012-03-30 DIAGNOSIS — R079 Chest pain, unspecified: Secondary | ICD-10-CM

## 2012-03-30 DIAGNOSIS — E119 Type 2 diabetes mellitus without complications: Secondary | ICD-10-CM | POA: Insufficient documentation

## 2012-03-30 DIAGNOSIS — IMO0002 Reserved for concepts with insufficient information to code with codable children: Secondary | ICD-10-CM

## 2012-03-30 DIAGNOSIS — K219 Gastro-esophageal reflux disease without esophagitis: Secondary | ICD-10-CM

## 2012-03-30 DIAGNOSIS — E1165 Type 2 diabetes mellitus with hyperglycemia: Secondary | ICD-10-CM | POA: Diagnosis present

## 2012-03-30 DIAGNOSIS — R0789 Other chest pain: Principal | ICD-10-CM

## 2012-03-30 DIAGNOSIS — I10 Essential (primary) hypertension: Secondary | ICD-10-CM

## 2012-03-30 DIAGNOSIS — M129 Arthropathy, unspecified: Secondary | ICD-10-CM | POA: Insufficient documentation

## 2012-03-30 LAB — CARDIAC PANEL(CRET KIN+CKTOT+MB+TROPI)
CK, MB: 3.8 ng/mL (ref 0.3–4.0)
Troponin I: <0.3 ng/mL (ref <0.30)
Troponin I: <0.3 ng/mL (ref <0.30)

## 2012-03-30 LAB — HEMOGLOBIN A1C
Hgb A1c MFr Bld: 7.5 % — ABNORMAL HIGH (ref <5.7)
Mean Plasma Glucose: 169 mg/dL — ABNORMAL HIGH (ref <117)

## 2012-03-30 LAB — BASIC METABOLIC PANEL
BUN: 17 mg/dL (ref 6–23)
CO2: 26 mEq/L (ref 19–32)
Calcium: 9.2 mg/dL (ref 8.4–10.5)
Creatinine, Ser: 0.88 mg/dL (ref 0.50–1.10)
GFR calc non Af Amer: 65 mL/min — ABNORMAL LOW (ref >90)
Glucose, Bld: 186 mg/dL — ABNORMAL HIGH (ref 70–99)

## 2012-03-30 LAB — CBC WITH DIFFERENTIAL/PLATELET
Eosinophils Absolute: 0.1 10*3/uL (ref 0.0–0.7)
Eosinophils Relative: 2 % (ref 0–5)
HCT: 36.6 % (ref 36.0–46.0)
Lymphocytes Relative: 24 % (ref 12–46)
Lymphs Abs: 1.6 10*3/uL (ref 0.7–4.0)
MCH: 31.4 pg (ref 26.0–34.0)
MCV: 95.8 fL (ref 78.0–100.0)
Monocytes Absolute: 0.8 10*3/uL (ref 0.1–1.0)
Monocytes Relative: 12 % (ref 3–12)
RBC: 3.82 MIL/uL — ABNORMAL LOW (ref 3.87–5.11)
WBC: 6.6 10*3/uL (ref 4.0–10.5)

## 2012-03-30 LAB — GLUCOSE, CAPILLARY

## 2012-03-30 LAB — D-DIMER, QUANTITATIVE: D-Dimer, Quant: 0.46 ug/mL-FEU (ref 0.00–0.48)

## 2012-03-30 MED ORDER — ONDANSETRON HCL 4 MG PO TABS: 4.0000 mg | ORAL_TABLET | Freq: Four times a day (QID) | ORAL | Status: DC | PRN

## 2012-03-30 MED ORDER — ONDANSETRON HCL 4 MG/2ML IJ SOLN
4.0000 mg | Freq: Four times a day (QID) | INTRAMUSCULAR | Status: DC | PRN
  Administered 2012-03-30: 4 mg via INTRAVENOUS
  Filled 2012-03-30: qty 2

## 2012-03-30 MED ORDER — NITROGLYCERIN 0.4 MG SL SUBL: 0.4000 mg | SUBLINGUAL_TABLET | SUBLINGUAL | Status: DC | PRN

## 2012-03-30 MED ORDER — FLUTICASONE PROPIONATE 50 MCG/ACT NA SUSP
2.0000 | Freq: Two times a day (BID) | NASAL | Status: DC | PRN
  Filled 2012-03-30: qty 16

## 2012-03-30 MED ORDER — METOPROLOL SUCCINATE ER 50 MG PO TB24
150.0000 mg | ORAL_TABLET | Freq: Every day | ORAL | Status: DC
  Administered 2012-03-30 – 2012-03-31 (×2): 150 mg via ORAL
  Filled 2012-03-30 (×3): qty 1

## 2012-03-30 MED ORDER — INSULIN ASPART 100 UNIT/ML ~~LOC~~ SOLN
0.0000 [IU] | Freq: Three times a day (TID) | SUBCUTANEOUS | Status: DC
  Administered 2012-03-30 (×2): 2 [IU] via SUBCUTANEOUS
  Administered 2012-03-31 (×2): 1 [IU] via SUBCUTANEOUS

## 2012-03-30 MED ORDER — ACETAMINOPHEN 650 MG RE SUPP: 650.0000 mg | Freq: Four times a day (QID) | RECTAL | Status: DC | PRN

## 2012-03-30 MED ORDER — POTASSIUM CHLORIDE CRYS ER 20 MEQ PO TBCR
20.0000 meq | EXTENDED_RELEASE_TABLET | Freq: Two times a day (BID) | ORAL | Status: DC
  Administered 2012-03-30 – 2012-03-31 (×2): 20 meq via ORAL
  Filled 2012-03-30 (×4): qty 1

## 2012-03-30 MED ORDER — ASPIRIN EC 325 MG PO TBEC
325.0000 mg | DELAYED_RELEASE_TABLET | Freq: Every day | ORAL | Status: DC
  Administered 2012-03-30 – 2012-03-31 (×2): 325 mg via ORAL
  Filled 2012-03-30 (×2): qty 1

## 2012-03-30 MED ORDER — ENOXAPARIN SODIUM 40 MG/0.4ML ~~LOC~~ SOLN
40.0000 mg | SUBCUTANEOUS | Status: DC
  Administered 2012-03-30 – 2012-03-31 (×2): 40 mg via SUBCUTANEOUS
  Filled 2012-03-30 (×3): qty 0.4

## 2012-03-30 MED ORDER — NITROGLYCERIN 2 % TD OINT
0.5000 [in_us] | TOPICAL_OINTMENT | Freq: Four times a day (QID) | TRANSDERMAL | Status: DC
  Administered 2012-03-30: 0.5 [in_us] via TOPICAL
  Filled 2012-03-30: qty 1

## 2012-03-30 MED ORDER — GLIMEPIRIDE 4 MG PO TABS
4.0000 mg | ORAL_TABLET | Freq: Every day | ORAL | Status: DC
  Administered 2012-03-30 – 2012-03-31 (×2): 4 mg via ORAL
  Filled 2012-03-30 (×4): qty 1

## 2012-03-30 MED ORDER — FUROSEMIDE 20 MG PO TABS
20.0000 mg | ORAL_TABLET | Freq: Every day | ORAL | Status: DC
  Administered 2012-03-30 – 2012-03-31 (×2): 20 mg via ORAL
  Filled 2012-03-30 (×2): qty 1

## 2012-03-30 MED ORDER — VITAMIN D3 25 MCG (1000 UNIT) PO TABS
1000.0000 [IU] | ORAL_TABLET | Freq: Every day | ORAL | Status: DC
  Administered 2012-03-30 – 2012-03-31 (×2): 1000 [IU] via ORAL
  Filled 2012-03-30 (×3): qty 1

## 2012-03-30 MED ORDER — ALUM & MAG HYDROXIDE-SIMETH 200-200-20 MG/5ML PO SUSP: 30.0000 mL | Freq: Four times a day (QID) | ORAL | Status: DC | PRN

## 2012-03-30 MED ORDER — PANTOPRAZOLE SODIUM 40 MG PO TBEC
40.0000 mg | DELAYED_RELEASE_TABLET | Freq: Every day | ORAL | Status: DC
  Administered 2012-03-30 – 2012-03-31 (×2): 40 mg via ORAL
  Filled 2012-03-30 (×2): qty 1

## 2012-03-30 MED ORDER — BENAZEPRIL HCL 40 MG PO TABS
40.0000 mg | ORAL_TABLET | Freq: Every day | ORAL | Status: DC
  Administered 2012-03-30 – 2012-03-31 (×2): 40 mg via ORAL
  Filled 2012-03-30 (×3): qty 1

## 2012-03-30 MED ORDER — FENOFIBRATE 160 MG PO TABS
160.0000 mg | ORAL_TABLET | Freq: Every day | ORAL | Status: DC
  Administered 2012-03-30 – 2012-03-31 (×2): 160 mg via ORAL
  Filled 2012-03-30 (×3): qty 1

## 2012-03-30 MED ORDER — ACETAMINOPHEN 325 MG PO TABS
650.0000 mg | ORAL_TABLET | Freq: Four times a day (QID) | ORAL | Status: DC | PRN
  Administered 2012-03-30 – 2012-03-31 (×3): 650 mg via ORAL
  Filled 2012-03-30 (×2): qty 2

## 2012-03-30 NOTE — ED Notes (Signed)
PT was given 2 ASA and 1 nitro SL - Pain was at a 5/10 now 3/10

## 2012-03-30 NOTE — ED Notes (Signed)
MD at bedside. Dr. Jeanie Sewer

## 2012-03-30 NOTE — H&P (Signed)
Patient's PCP: Eartha Inch, MD  Chief Complaint: Chest pain  History of Present Illness: [name] is a 71 y.o. Caucasian female with history of hypertension, GERD, diabetes, hyperlipidemia, and arthritis who presents with the above complaints.  Patient reports that over the last week she's been having some intermittent left-sided chest pains.  Chest pain is not associated with activity.  Pain is worse laying down.  Last night she had pain that went to the back and to the neck as a result she presented to the emergency department for further evaluation.  Patient was given nitroglycerin by the EMS and her pain had improved.  She feels like there is gas around the heart to is causing her pain.  She denies any recent fevers, chills, nausea or vomiting.  Denies any shortness of breath.  Denies any abdominal pain or diarrhea.  Chronically has daily headaches.  Denies any vision changes.  Patient does admit to sleeping on her left side as she has a bad right shoulder.  Patient also admits to having some stress at home, as her granddaughter wants to go to college, however her son does not want her to go to college.  Given her history of hypertension and diabetes the hospitalist service was asked to admit the patient for further care and management.  Past Medical History  Diagnosis Date  . Hypertension   . GERD (gastroesophageal reflux disease)   . Diabetes mellitus   . Hyperlipidemia   . Arthritis    Past Surgical History  Procedure Date  . Shoulder surgery   . Back surgery   . Abdominal hysterectomy    Family History  Problem Relation Age of Onset  . Heart failure Mother   . Heart attack Father    History   Social History  . Marital Status: Married    Spouse Name: N/A    Number of Children: N/A  . Years of Education: N/A   Occupational History  . Not on file.   Social History Main Topics  . Smoking status: Former Smoker    Quit date: 03/30/1972  . Smokeless tobacco: Not  on file   Comment: Quit 40 years ago  . Alcohol Use: No  . Drug Use: No  . Sexually Active:    Other Topics Concern  . Not on file   Social History Narrative  . No narrative on file   Allergies: Review of patient's allergies indicates no known allergies.  Meds: Scheduled Meds:   . DISCONTD: nitroGLYCERIN  0.5 inch Topical Q6H   Continuous Infusions:  PRN Meds:.  Review of Systems: All systems reviewed with the patient and positive as per history of present illness, otherwise all other systems are negative.  Physical Exam: Blood pressure 166/74, pulse 98, temperature 97.7 F (36.5 C), temperature source Oral, resp. rate 18, SpO2 97.00%. General: Awake, Oriented x3, No acute distress. HEENT: EOMI, Moist mucous membranes Neck: Supple CV: S1 and S2 Chest: Pain was reproducible on palpation on the left side of her chest. Lungs: Clear to ascultation bilaterally Abdomen: Soft, Nontender, Nondistended, +bowel sounds. Ext: Good pulses. Trace edema. No clubbing or cyanosis noted. Neuro: Cranial Nerves II-XII grossly intact. Has 5/5 motor strength in upper and lower extremities.  Lab results:  [facility] 03/30/12 0443  NA 139  K 3.6  CL 102  CO2 26  GLUCOSE 186*  BUN 17  CREATININE 0.88  CALCIUM 9.2  MG --  PHOS --   No results found for this basename: AST:2,ALT:2,ALKPHOS:2,BILITOT:2,PROT:2,ALBUMIN:2  in the last 72 hours No results found for this basename: LIPASE:2,AMYLASE:2 in the last 72 hours  Basename 03/30/12 0443  WBC 6.6  NEUTROABS 4.1  HGB 12.0  HCT 36.6  MCV 95.8  PLT 213    Basename 03/30/12 0443  CKTOTAL --  CKMB --  CKMBINDEX --  TROPONINI <0.30   No components found with this basename: POCBNP:3  Basename 03/30/12 0443  DDIMER 0.46   No results found for this basename: HGBA1C:2 in the last 72 hours No results found for this basename: CHOL:2,HDL:2,LDLCALC:2,TRIG:2,CHOLHDL:2,LDLDIRECT:2 in the last 72 hours No results found for this basename:  TSH,T4TOTAL,FREET3,T3FREE,THYROIDAB in the last 72 hours No results found for this basename: VITAMINB12:2,FOLATE:2,FERRITIN:2,TIBC:2,IRON:2,RETICCTPCT:2 in the last 72 hours Imaging results:  Dg Chest 2 View  03/30/2012  *RADIOLOGY REPORT*  Clinical Data: Chest pain  CHEST - 2 VIEW  Comparison: 07/14/2011  Findings: Heart size upper normal, similar to prior.  No focal consolidation, pleural effusion, or pneumothorax.  Surgical hardware right humerus, incompletely imaged. Unchanged compression deformities post vertebroplasty. Flowing anterior osteophytes.  No acute osseous finding. Surgical clips right upper quadrant.  IMPRESSION: No radiographic evidence of acute cardiopulmonary process.  Original Report Authenticated By: Waneta Martins, M.D.   Other results: EKG: Normal sinus rhythm, inverted T waves in leads V1.  Assessment & Plan by Problem: Atypical chest pain Suspect is likely musculoskeletal as it is reproducible on exam.  Given patient's risk factors, admitted the patient as observation to telemetry, cycle cardiac enzymes to the patient for acute coronary syndrome.  Initial troponin negative.  Continue aspirin and continue metoprolol.  If patient is ruled out for acute coronary syndrome, likely will need outpatient cardiology evaluation.  Patient reports that she has had a stress test more than 5 years ago.  Uncertain if the stress at home may be contributing to her symptoms.  As the patient is already on a PPI, may benefit from outpatient GI evaluation if she has persistent symptoms for possible worsening GERD evaluation.  Hypertension Slightly elevated.  Continue home antihypertensive medications, metoprolol, benazepril, and furosemide.  Continue to monitor.  GERD Continue PPI.  Type 2 diabetes uncontrolled with complications Blood sugar stable so far.  Sensitive sliding scale insulin.  Hold metformin.  Continue glimepiride.  Send for hemoglobin A1c.  Diabetic  diet.  Hyperlipidemia Continue fenofibrate.  Send for lipid panel tomorrow.  Arthritis Stable.  Prophylaxis Lovenox.  CODE STATUS Full code.  This was addressed with the patient at the time of admission.  Disposition Admit the patient as observation to telemetry.  The patient is ruled out for acute coronary syndrome consider discharge tomorrow with outpatient evaluation with cardiology.  Time spent on admission, talking to the patient, and coordinating care was: 60 mins.  Alwilda Gilland A, MD 03/30/2012, 8:30 AM

## 2012-03-30 NOTE — ED Notes (Addendum)
Pt requesting to have IV removed and placed in a different location when her next labs are obtained. Ray RN aware of pt's request

## 2012-03-30 NOTE — ED Notes (Signed)
Per EMS:  Pt reports CP x 2 days.  Pt states: "It feels like a lot of gas around my heart".  Pt was reported to have several episodes of burping - has hx of GERD.  CP on left side.

## 2012-03-30 NOTE — ED Provider Notes (Signed)
History     CSN: [id number]  Arrival date & time 03/30/12  0400   First MD Initiated Contact with Patient 03/30/12 0408      Chief Complaint  Patient presents with  . Chest Pain    (Consider location/radiation/quality/duration/timing/severity/associated sxs/prior treatment) HPI 71 year old female presents to emergency room via EMS with complaint of chest pressure and pain. Patient reports she's been having intermittent chest pains throughout the week. Pain is usually worse at night when she is laying flat. Tonight she had radiation of the pain into her back and neck associated with nausea without emesis. Patient reports she was sweaty with the pain. She reports the pain improved slightly with the nitroglycerin given to her by EMS. Patient feels as though she has had gas surrounding her heart intermittently over the week. Symptoms last anywhere from 5-15 minutes, although tonight episode lasted longer. Patient has been involved with a church revival this week, and reports she was prayed over on Wednesday and felt slightly better after this intervention. Patient has history of diabetes and hypertension. She was evaluated by [facility] our cardiology 5 years ago with Myoview which was reported to be negative. She is unsure of family history of coronary disease. Patient has a remote history of smoking. Patient has a history of reflux, but reports her current symptoms are not like her reflux.  Past Medical History  Diagnosis Date  . Hypertension   . GERD (gastroesophageal reflux disease)   . Diabetes mellitus   . Hyperlipidemia   . Arthritis     Past Surgical History  Procedure Date  . Shoulder surgery   . Back surgery   . Abdominal hysterectomy     History reviewed. No pertinent family history.  History  Substance Use Topics  . Smoking status: Former Games developer  . Smokeless tobacco: Not on file  . Alcohol Use: No    OB History    Grav Para Term Preterm Abortions TAB SAB Ect Mult Living                    Review of Systems  All other systems reviewed and are negative.    Allergies  Review of patient's allergies indicates no known allergies.  Home Medications   Current Outpatient Rx  Name Route Sig Dispense Refill  . BENAZEPRIL HCL 40 MG PO TABS Oral Take 40 mg by mouth daily.      Marland Kitchen VITAMIN D 1000 UNITS PO TABS Oral Take 1,000 Units by mouth daily.      . FENOFIBRATE 160 MG PO TABS Oral Take 160 mg by mouth daily.    Marland Kitchen FLUTICASONE PROPIONATE 50 MCG/ACT NA SUSP Nasal Place 2 sprays into the nose 2 (two) times daily as needed. allergies     . FUROSEMIDE 20 MG PO TABS Oral Take 20 mg by mouth daily. fluid    . GLIMEPIRIDE 4 MG PO TABS Oral Take 4 mg by mouth daily before breakfast.      . METFORMIN HCL ER 500 MG PO TB24 Oral Take 500 mg by mouth daily with breakfast.    . METOPROLOL SUCCINATE ER 100 MG PO TB24 Oral Take 150 mg by mouth daily. Pt takes 1.5 tab ( 150mg  dose)    . OMEPRAZOLE 20 MG PO CPDR Oral Take 20 mg by mouth 2 (two) times daily.      Marland Kitchen POTASSIUM CHLORIDE CRYS ER 20 MEQ PO TBCR Oral Take 20 mEq by mouth 2 (two) times daily.      Marland Kitchen  ASPIRIN 81 MG PO CHEW Oral Chew 162 mg by mouth once.    Marland Kitchen NITROGLYCERIN 0.4 MG SL SUBL Sublingual Place 0.4 mg under the tongue once.      BP 162/80  Pulse 110  Temp 97.7 F (36.5 C) (Oral)  Resp 18  SpO2 98%  Physical Exam  Nursing note and vitals reviewed. Constitutional: She is oriented to person, place, and time. She appears well-developed and well-nourished. She appears distressed (Anxious appearing).  HENT:  Head: Normocephalic and atraumatic.  Nose: Nose normal.  Mouth/Throat: Oropharynx is clear and moist.  Eyes: Conjunctivae and EOM are normal. Pupils are equal, round, and reactive to light.  Neck: Normal range of motion. Neck supple. No JVD present. No tracheal deviation present. No thyromegaly present.  Cardiovascular: Normal rate, regular rhythm, normal heart sounds and intact distal pulses.  Exam  reveals no gallop and no friction rub.   No murmur heard. Pulmonary/Chest: Effort normal and breath sounds normal. No stridor. No respiratory distress. She has no wheezes. She has no rales. She exhibits no tenderness.  Abdominal: Soft. Bowel sounds are normal. She exhibits no distension and no mass. There is no tenderness. There is no rebound and no guarding.  Musculoskeletal: Normal range of motion. She exhibits no edema and no tenderness.  Lymphadenopathy:    She has no cervical adenopathy.  Neurological: She is alert and oriented to person, place, and time. She exhibits normal muscle tone. Coordination normal.  Skin: Skin is dry. No rash noted. No erythema. No pallor.  Psychiatric: Her behavior is normal. Judgment and thought content normal.    ED Course  Procedures (including critical care time)  Labs Reviewed  CBC WITH DIFFERENTIAL - Abnormal; Notable for the following:    RBC 3.82 (*)     All other components within normal limits  BASIC METABOLIC PANEL - Abnormal; Notable for the following:    Glucose, Bld 186 (*)     GFR calc non Af Amer 65 (*)     GFR calc Af Amer 75 (*)     All other components within normal limits  TROPONIN I  D-DIMER, QUANTITATIVE   Dg Chest 2 View  03/30/2012  *RADIOLOGY REPORT*  Clinical Data: Chest pain  CHEST - 2 VIEW  Comparison: 07/14/2011  Findings: Heart size upper normal, similar to prior.  No focal consolidation, pleural effusion, or pneumothorax.  Surgical hardware right humerus, incompletely imaged. Unchanged compression deformities post vertebroplasty. Flowing anterior osteophytes.  No acute osseous finding. Surgical clips right upper quadrant.  IMPRESSION: No radiographic evidence of acute cardiopulmonary process.  Original Report Authenticated By: Waneta Martins, M.D.    Date: 03/30/2012  Rate: 111  Rhythm: sinus tachycardia  QRS Axis: normal  Intervals: normal  ST/T Wave abnormalities: normal  Conduction Disutrbances:none  Narrative  Interpretation: rate faster than prior  Old EKG Reviewed: changes noted    1. Chest pain   2. Hyperglycemia   3. Hypertension       MDM  71 year old female with history of hypertension, hyperlipidemia, diabetes with chest pain. Symptoms seems atypical, but weren't new and different tonight from what she's been experiencing throughout the week. EKG without ST elevation. Lab work without significant findings other than hyperglycemia. Will discuss with hospitalist for admission given her multiple risk factors.        Olivia Mackie, MD 03/30/12 [id number]

## 2012-03-30 NOTE — ED Notes (Signed)
Dr. Betti Cruz informed this RN that he assessed pt and states she is suitable for 3W.

## 2012-03-31 ENCOUNTER — Encounter (HOSPITAL_COMMUNITY): Payer: Self-pay | Admitting: Physician Assistant

## 2012-03-31 DIAGNOSIS — R079 Chest pain, unspecified: Secondary | ICD-10-CM

## 2012-03-31 DIAGNOSIS — E785 Hyperlipidemia, unspecified: Secondary | ICD-10-CM

## 2012-03-31 LAB — CBC
HCT: 40.8 % (ref 36.0–46.0)
Hemoglobin: 13.5 g/dL (ref 12.0–15.0)
MCV: 96.5 fL (ref 78.0–100.0)
RBC: 4.23 MIL/uL (ref 3.87–5.11)
WBC: 6.1 10*3/uL (ref 4.0–10.5)

## 2012-03-31 LAB — BASIC METABOLIC PANEL
CO2: 26 mEq/L (ref 19–32)
Chloride: 100 mEq/L (ref 96–112)
Creatinine, Ser: 0.86 mg/dL (ref 0.50–1.10)
Potassium: 4.6 mEq/L (ref 3.5–5.1)

## 2012-03-31 LAB — LIPID PANEL
Cholesterol: 224 mg/dL — ABNORMAL HIGH (ref 0–200)
HDL: 50 mg/dL (ref >39)
LDL Cholesterol: 119 mg/dL — ABNORMAL HIGH (ref 0–99)
Total CHOL/HDL Ratio: 4.5 RATIO
Triglycerides: 277 mg/dL — ABNORMAL HIGH (ref <150)
VLDL: 55 mg/dL — ABNORMAL HIGH (ref 0–40)

## 2012-03-31 LAB — GLUCOSE, CAPILLARY: Glucose-Capillary: 105 mg/dL — ABNORMAL HIGH (ref 70–99)

## 2012-03-31 MED ORDER — ATORVASTATIN CALCIUM 20 MG PO TABS: 20.0000 mg | ORAL_TABLET | Freq: Every day | ORAL | Status: DC

## 2012-03-31 MED ORDER — PANTOPRAZOLE SODIUM 40 MG PO TBEC: 40.0000 mg | DELAYED_RELEASE_TABLET | Freq: Two times a day (BID) | ORAL | Status: DC

## 2012-03-31 MED ORDER — LIVING WELL WITH DIABETES BOOK
Freq: Once | Status: AC
  Administered 2012-03-31: 08:00:00
  Filled 2012-03-31: qty 1

## 2012-03-31 MED ORDER — PANTOPRAZOLE SODIUM 40 MG PO TBEC: 40.0000 mg | DELAYED_RELEASE_TABLET | Freq: Every day | ORAL | Status: DC

## 2012-03-31 NOTE — Progress Notes (Signed)
Subjective: Feels better, chest pain resolved  Objective: Vital signs in last 24 hours: Temp:  [97.7 F (36.5 C)-98.4 F (36.9 C)] 97.7 F (36.5 C) (08/12 0500) Pulse Rate:  [70-82] 71  (08/12 0930) Resp:  [18-20] 18  (08/12 0500) BP: (125-153)/(73-83) 144/81 mmHg (08/12 0930) SpO2:  [97 %-98 %] 97 % (08/12 0500) Weight:  [95.029 kg (209 lb 8 oz)-96.163 kg (212 lb)] 95.029 kg (209 lb 8 oz) (08/12 0500) Weight change:  Last BM Date: 03/31/12  Intake/Output from previous day:       Physical Exam: General: Alert, awake, oriented x3, in no acute distress. HEENT: No bruits, no goiter. Heart: Regular rate and rhythm, without murmurs, rubs, gallops. Lungs: Clear to auscultation bilaterally. Abdomen: Soft, nontender, nondistended, positive bowel sounds. Extremities: No clubbing cyanosis or edema with positive pedal pulses. Neuro: Grossly intact, nonfocal.    Lab Results: Basic Metabolic Panel:  Basename 03/31/12 0641 03/30/12 0443  NA 136 139  K 4.6 3.6  CL 100 102  CO2 26 26  GLUCOSE 147* 186*  BUN 15 17  CREATININE 0.86 0.88  CALCIUM 9.7 9.2  MG -- --  PHOS -- --   Liver Function Tests: No results found for this basename: AST:2,ALT:2,ALKPHOS:2,BILITOT:2,PROT:2,ALBUMIN:2 in the last 72 hours No results found for this basename: LIPASE:2,AMYLASE:2 in the last 72 hours No results found for this basename: AMMONIA:2 in the last 72 hours CBC:  Basename 03/31/12 0641 03/30/12 0443  WBC 6.1 6.6  NEUTROABS -- 4.1  HGB 13.5 12.0  HCT 40.8 36.6  MCV 96.5 95.8  PLT 266 213   Cardiac Enzymes:  Basename 03/30/12 1656 03/30/12 1142 03/30/12 0443  CKTOTAL 67 65 --  CKMB 4.0 3.8 --  CKMBINDEX -- -- --  TROPONINI <0.30 <0.30 <0.30   BNP: No results found for this basename: PROBNP:3 in the last 72 hours D-Dimer:  Basename 03/30/12 0443  DDIMER 0.46   CBG:  Basename 03/31/12 0741 03/30/12 2141 03/30/12 1620 03/30/12 1219  GLUCAP 140* 131* 159* 178*   Hemoglobin  A1C:  Basename 03/30/12 1143  HGBA1C 7.5*   Fasting Lipid Panel:  Basename 03/31/12 0641  CHOL 224*  HDL 50  LDLCALC 119*  TRIG 277*  CHOLHDL 4.5  LDLDIRECT --   Thyroid Function Tests: No results found for this basename: TSH,T4TOTAL,FREET4,T3FREE,THYROIDAB in the last 72 hours Anemia Panel: No results found for this basename: VITAMINB12,FOLATE,FERRITIN,TIBC,IRON,RETICCTPCT in the last 72 hours Coagulation: No results found for this basename: LABPROT:2,INR:2 in the last 72 hours Urine Drug Screen: Drugs of Abuse  No results found for this basename: labopia, cocainscrnur, labbenz, amphetmu, thcu, labbarb    Alcohol Level: No results found for this basename: ETH:2 in the last 72 hours Urinalysis: No results found for this basename: COLORURINE:2,APPERANCEUR:2,LABSPEC:2,PHURINE:2,GLUCOSEU:2,HGBUR:2,BILIRUBINUR:2,KETONESUR:2,PROTEINUR:2,UROBILINOGEN:2,NITRITE:2,LEUKOCYTESUR:2 in the last 72 hours  No results found for this or any previous visit (from the past 240 hour(s)).  Studies/Results: Dg Chest 2 View  03/30/2012  *RADIOLOGY REPORT*  Clinical Data: Chest pain  CHEST - 2 VIEW  Comparison: 07/14/2011  Findings: Heart size upper normal, similar to prior.  No focal consolidation, pleural effusion, or pneumothorax.  Surgical hardware right humerus, incompletely imaged. Unchanged compression deformities post vertebroplasty. Flowing anterior osteophytes.  No acute osseous finding. Surgical clips right upper quadrant.  IMPRESSION: No radiographic evidence of acute cardiopulmonary process.  Original Report Authenticated By: Waneta Martins, M.D.    Medications: Scheduled Meds:   . aspirin EC  325 mg Oral Daily  . benazepril  40 mg  Oral Daily  . cholecalciferol  1,000 Units Oral Daily  . enoxaparin (LOVENOX) injection  40 mg Subcutaneous Q24H  . fenofibrate  160 mg Oral Daily  . furosemide  20 mg Oral Daily  . glimepiride  4 mg Oral QAC breakfast  . insulin aspart  0-9 Units  Subcutaneous TID WC  . living well with diabetes book   Does not apply Once  . metoprolol succinate  150 mg Oral Daily  . pantoprazole  40 mg Oral Q1200  . potassium chloride SA  20 mEq Oral BID   Continuous Infusions:  PRN Meds:.acetaminophen, acetaminophen, alum & mag hydroxide-simeth, fluticasone, nitroGLYCERIN, ondansetron (ZOFRAN) IV, ondansetron  Assessment/Plan:  Principal Problem: 1. Atypical chest pain: with some typical features, resolved Very poor historian EKG normal, cardiac enzymes x 2 negative, could be musculoskeletal or GI in origin too Reports normal cardiac cath in 2008 by Dr.Brodie, do not see this in our system Has multiple cardiac risk factors, DM/HTN/Dyslipdemia and family history Request [place] cards consult for Inpatient Vs outpatient workup/myoview Increase PPI to BID  2. DM: continue glimepiride, SSI Check Hbaic, requests dietary consult  3. HTN: stable, continue benazepril and metoprolol  4. Dyslipidemia: continue fenofibrate, LDL 119- add statin  5. Obesity: lifestyle modification  6. GERD: PPI change to BID  Dispo: pending cards eval, inpatient vs outpatient workup   LOS: 1 day   [facility] Pager: [phone] 03/31/2012, 10:56 AM

## 2012-03-31 NOTE — Plan of Care (Signed)
Problem: Food- and Nutrition-Related Knowledge Deficit (NB-1.1) Goal: Nutrition education Formal process to instruct or train a patient/client in a skill or to impart knowledge to help patients/clients voluntarily manage or modify food choices and eating behavior to maintain or improve health.  Outcome: Completed/Met Date Met:  03/31/12  RD consulted for weight loss education. "Weight Loss Tips" handouts provided from [employer]. Reviewed guidelines and recommendations. Encouraged physical activity as medically appropriate.   Expect fair compliance.  Body mass index is 34.86 kg/(m^2). Pt meets criteria for Obesity Class I based on current BMI.  No further nutrition interventions warranted at this time. Patient discharging home today.  Kirkland Hun, RD, LDN Pager #: [phone] After-Hours Pager #: [phone]

## 2012-03-31 NOTE — Progress Notes (Signed)
Utilization review complete 

## 2012-03-31 NOTE — Consult Note (Signed)
Cardiology Consult Note   Patient ID: [name] MRN: [id number], DOB/AGE: Jul 30, 1941   Admit date: 03/30/2012 Date of Consult: 03/31/2012  Primary Physician: Eartha Inch, MD Primary Cardiologist: Prior cath by Dr. Juanda Chance - seen in consultation by Dr. Myrtis Ser  Reason for consult: evaluation/management of chest pain  HPI:   [name] is a 71yo female with PMHx significant for prior cardiac catheterization (15 years ago, does not know results, denies intervention), type 2 DM, HL, HTN, GERD, arthritis and obesity who was admitted to [facility] on 03/30/12 for atypical chest pain.   She reports experiencing intermittent, L-sided squeezing chest pain last week lasting 15-20 minutes aggravated by laying flat with associated n/v. Denies shortness of breath, lightheadedness, diaphoresis, palpitations, PND, orthopnea, new cough or LE edema. No fevers or chills. These episodes can occur at any time of the day. She notes occasionally they occur when cleaning the house. She reports that she does experience indigestion and relates her pain most to this. "Feels like gas in my chest." These episodes are associated with belching. She notes a history a dysphagia. She sees Dr. Danise Edge with GI for this. She was laying on her left side and experienced an episode of chest pain, more intense than before, and this frightened her as she was alone. This prompted her presentation to the [facility] ED.   On ED arrival, EKG revealed sinus rhythm without ischemic changes. Initial trop-I returned WNL. BMET and CBC unremarkable. D-dimer WNL. The patient was admitted by the medicine service for ACS rule out. Hgb A1C returned WNL. Lipid panel was ordered revealing LDL 119, TG 277, TC 224 and HDL 50. Statin was added. The patient remained stable overnight. PPI was increased to BID dosing. The pain was suspected to be musculoskeletal and reproducible on exam. Cardiac biomarkers returned WNL x 3 total, and she  ruled out. Cardiology was consulted regarding further work-up going forward.   Problem List: Past Medical History  Diagnosis Date  . Hypertension   . GERD (gastroesophageal reflux disease)   . Diabetes mellitus   . Hyperlipidemia   . Arthritis     Past Surgical History  Procedure Date  . Shoulder surgery   . Back surgery   . Abdominal hysterectomy      Allergies: No Known Allergies  Home Medications: Prior to Admission medications   Medication Sig Start Date End Date Taking? Authorizing Provider  benazepril (LOTENSIN) 40 MG tablet Take 40 mg by mouth daily.     Yes Historical Provider, MD  cholecalciferol (VITAMIN D) 1000 UNITS tablet Take 1,000 Units by mouth daily.     Yes Historical Provider, MD  fenofibrate 160 MG tablet Take 160 mg by mouth daily.   Yes Historical Provider, MD  fluticasone (FLONASE) 50 MCG/ACT nasal spray Place 2 sprays into the nose 2 (two) times daily as needed. allergies    Yes Historical Provider, MD  furosemide (LASIX) 20 MG tablet Take 20 mg by mouth daily. fluid   Yes Historical Provider, MD  glimepiride (AMARYL) 4 MG tablet Take 4 mg by mouth daily before breakfast.     Yes Historical Provider, MD  metFORMIN (GLUCOPHAGE-XR) 500 MG 24 hr tablet Take 500 mg by mouth daily with breakfast.   Yes Historical Provider, MD  metoprolol (TOPROL-XL) 100 MG 24 hr tablet Take 150 mg by mouth daily. Pt takes 1.5 tab ( 150mg  dose)   Yes Historical Provider, MD  omeprazole (PRILOSEC) 20 MG capsule Take 20 mg by  mouth 2 (two) times daily.     Yes Historical Provider, MD  potassium chloride SA (K-DUR,KLOR-CON) 20 MEQ tablet Take 20 mEq by mouth 2 (two) times daily.     Yes Historical Provider, MD  aspirin 81 MG chewable tablet Chew 162 mg by mouth once.    Historical Provider, MD  nitroGLYCERIN (NITROSTAT) 0.4 MG SL tablet Place 0.4 mg under the tongue once.    Historical Provider, MD    Inpatient Medications:     . aspirin EC  325 mg Oral Daily  . benazepril  40  mg Oral Daily  . cholecalciferol  1,000 Units Oral Daily  . enoxaparin (LOVENOX) injection  40 mg Subcutaneous Q24H  . fenofibrate  160 mg Oral Daily  . furosemide  20 mg Oral Daily  . glimepiride  4 mg Oral QAC breakfast  . insulin aspart  0-9 Units Subcutaneous TID WC  . living well with diabetes book   Does not apply Once  . metoprolol succinate  150 mg Oral Daily  . pantoprazole  40 mg Oral BID AC  . potassium chloride SA  20 mEq Oral BID  . DISCONTD: pantoprazole  40 mg Oral Q1200   Prescriptions prior to admission  Medication Sig Dispense Refill  . benazepril (LOTENSIN) 40 MG tablet Take 40 mg by mouth daily.        . cholecalciferol (VITAMIN D) 1000 UNITS tablet Take 1,000 Units by mouth daily.        . fenofibrate 160 MG tablet Take 160 mg by mouth daily.      . fluticasone (FLONASE) 50 MCG/ACT nasal spray Place 2 sprays into the nose 2 (two) times daily as needed. allergies       . furosemide (LASIX) 20 MG tablet Take 20 mg by mouth daily. fluid      . glimepiride (AMARYL) 4 MG tablet Take 4 mg by mouth daily before breakfast.        . metFORMIN (GLUCOPHAGE-XR) 500 MG 24 hr tablet Take 500 mg by mouth daily with breakfast.      . metoprolol (TOPROL-XL) 100 MG 24 hr tablet Take 150 mg by mouth daily. Pt takes 1.5 tab ( 150mg  dose)      . omeprazole (PRILOSEC) 20 MG capsule Take 20 mg by mouth 2 (two) times daily.        . potassium chloride SA (K-DUR,KLOR-CON) 20 MEQ tablet Take 20 mEq by mouth 2 (two) times daily.        Marland Kitchen aspirin 81 MG chewable tablet Chew 162 mg by mouth once.      . nitroGLYCERIN (NITROSTAT) 0.4 MG SL tablet Place 0.4 mg under the tongue once.        Family History  Problem Relation Age of Onset  . Heart failure Mother   . Heart attack Father      History   Social History  . Marital Status: Married    Spouse Name: N/A    Number of Children: N/A  . Years of Education: N/A   Occupational History  . Not on file.   Social History Main Topics  .  Smoking status: Former Smoker    Quit date: 03/30/1972  . Smokeless tobacco: Not on file   Comment: Quit 40 years ago  . Alcohol Use: No  . Drug Use: No  . Sexually Active: No   Other Topics Concern  . Not on file   Social History Narrative  . No narrative on file  Review of Systems: General: negative for chills, fever, night sweats or weight changes.  Cardiovascular: positive for chest pain, edema, negative dyspnea on exertion, orthopnea, palpitations, paroxysmal nocturnal dyspnea or shortness of breath Dermatological: negative for rash Respiratory: negative for cough or wheezing Urologic: negative for hematuria Abdominal: positive nausea, vomiting, belching, negative for diarrhea, bright red blood per rectum, melena, or hematemesis Neurologic: negative for visual changes, syncope, or dizziness All other systems reviewed and are otherwise negative except as noted above.  Physical Exam: Blood pressure 144/81, pulse 71, temperature 97.7 F (36.5 C), temperature source Oral, resp. rate 18, height 5\' 5"  (1.651 m), weight 95.029 kg (209 lb 8 oz), SpO2 97.00%.    General: Obese, well developed, in no acute distress. Head: Normocephalic, atraumatic, sclera non-icteric, no xanthomas, nares are without discharge. Neck: Negative for carotid bruits. JVD not elevated. Lungs: Clear bilaterally to auscultation without wheezes, rales, or rhonchi. Breathing is unlabored. Heart: RRR with S1 S2. No murmurs, rubs, or gallops appreciated. Abdomen: Soft, non-tender, non-distended with normoactive bowel sounds. No hepatomegaly. No rebound/guarding. No obvious abdominal masses. Msk:  Mild chest discomfort elicited on palpation. Strength and tone appears normal for age. Extremities: 1+ pitting pretibial edema bilaterally. No clubbing or cyanosis.  Distal pedal pulses are 2+ and equal bilaterally. Neuro: Alert and oriented X 3. Moves all extremities spontaneously. Psych:  Responds to questions  appropriately with a normal affect.  Labs: Recent Labs  Basename 03/31/12 0641 03/30/12 0443   WBC 6.1 6.6   HGB 13.5 12.0   HCT 40.8 36.6   MCV 96.5 95.8   PLT 266 213   Recent Labs  Basename 03/30/12 0443   DDIMER 0.46    Lab 03/31/12 0641 03/30/12 0443  NA 136 139  K 4.6 3.6  CL 100 102  CO2 26 26  BUN 15 17  CREATININE 0.86 0.88  CALCIUM 9.7 9.2  PROT -- --  BILITOT -- --  ALKPHOS -- --  ALT -- --  AST -- --  AMYLASE -- --  LIPASE -- --  GLUCOSE 147* 186*   Recent Labs  Basename 03/30/12 1143   HGBA1C 7.5*   Recent Labs  Basename 03/30/12 1656 03/30/12 1142 03/30/12 0443   CKTOTAL 67 65 --   CKMB 4.0 3.8 --   CKMBINDEX -- -- --   TROPONINI <0.30 <0.30 <0.30   Recent Labs  Basename 03/31/12 0641   CHOL 224*   HDL 50   LDLCALC 119*   TRIG 277*   CHOLHDL 4.5   LDLDIRECT --    Radiology/Studies: Dg Chest 2 View  03/30/2012  *RADIOLOGY REPORT*  Clinical Data: Chest pain  CHEST - 2 VIEW  Comparison: 07/14/2011  Findings: Heart size upper normal, similar to prior.  No focal consolidation, pleural effusion, or pneumothorax.  Surgical hardware right humerus, incompletely imaged. Unchanged compression deformities post vertebroplasty. Flowing anterior osteophytes.  No acute osseous finding. Surgical clips right upper quadrant.  IMPRESSION: No radiographic evidence of acute cardiopulmonary process.  Original Report Authenticated By: Waneta Martins, M.D.   EKG: sinus tachycardia/NSR, 88-111 bpm, no ST-T wave changes   ASSESSMENT:   1. Atypical chest pain 2. Type 2 DM 3. HTN 4. HL 5. GERD 6. Obesity 7. History of tobacco abuse  DISCUSSION/PLAN:  Agree with the primary team in that the patient's chest pain is quite atypical and more consistent with reflux symptoms or a musculoskeletal etiology; however, the patient does have significant cardiac risk factors. History is clouded in that  the patient is a rather poor historian. Patient does have LE edema,  but states it decreases with elevation. No evidence of CHF on exam. Patient has ruled out and chest pain has resolved. Agree with management of reflux symptoms per primary team. Stable for discharge from cardiac perspective. Will schedule outpatient Lexiscan Myoview and follow-up with Dr. Myrtis Ser. Outpatient echo may be determined at that time, however feel swelling may represent dependent edema secondary to obesity and venous insufficiency. Praised on continued tobacco cessation. Advised on alternatives to weight loss given chronic LE orthopedic pain. Agree with statin therapy for risk factor modification. Further recommendations per MD.    Signed, R. Hurman Horn, PA-C 03/31/2012, 2:01 PM  Patient seen and examined. I agree with the assessment and plan as detailed above. See also my additional thoughts below.   I have seen and examined the patient. I've also discussed the case with Mr. Shaune Spittle. At this time I feel that her symptoms are not. She is stable. There is no diagnostic EKG change. Enzymes are normal. I recommend that she be allowed to be discharged home with outpatient stress test and followup with me in the office. These arrangements are being made.  Willa Rough, MD, [facility] 03/31/2012 2:59 PM

## 2012-03-31 NOTE — Progress Notes (Signed)
Pt discharged to home per MD order. Pt received and reviewed with RN all discharge instructions including prescription information and follow-up appointments.  Pt at discharge states she cannot take lipitor as prescribed by Dr. Jomarie Longs.  RN paged Dr. Jomarie Longs who instructed pt to not take lipitor and to follow-up with primary care MD after discharge.  Pt alert and oriented at discharge with no complaints of pain.  Pt instructed per discharge orders to return to [facility] cardiology for stress test in am.  Efraim Kaufmann

## 2012-04-01 ENCOUNTER — Ambulatory Visit (HOSPITAL_COMMUNITY): Payer: Medicare Other | Attending: Cardiovascular Disease | Admitting: Radiology

## 2012-04-01 VITALS — BP 142/76 | Ht 65.0 in | Wt 208.0 lb

## 2012-04-01 DIAGNOSIS — E785 Hyperlipidemia, unspecified: Secondary | ICD-10-CM

## 2012-04-01 DIAGNOSIS — R079 Chest pain, unspecified: Secondary | ICD-10-CM

## 2012-04-01 DIAGNOSIS — E119 Type 2 diabetes mellitus without complications: Secondary | ICD-10-CM | POA: Insufficient documentation

## 2012-04-01 DIAGNOSIS — IMO0002 Reserved for concepts with insufficient information to code with codable children: Secondary | ICD-10-CM

## 2012-04-01 DIAGNOSIS — Z87891 Personal history of nicotine dependence: Secondary | ICD-10-CM | POA: Insufficient documentation

## 2012-04-01 DIAGNOSIS — I10 Essential (primary) hypertension: Secondary | ICD-10-CM

## 2012-04-01 DIAGNOSIS — R5381 Other malaise: Secondary | ICD-10-CM | POA: Insufficient documentation

## 2012-04-01 DIAGNOSIS — Z8249 Family history of ischemic heart disease and other diseases of the circulatory system: Secondary | ICD-10-CM | POA: Insufficient documentation

## 2012-04-01 DIAGNOSIS — E118 Type 2 diabetes mellitus with unspecified complications: Secondary | ICD-10-CM

## 2012-04-01 DIAGNOSIS — R000 Tachycardia, unspecified: Secondary | ICD-10-CM | POA: Insufficient documentation

## 2012-04-01 MED ORDER — TECHNETIUM TC 99M TETROFOSMIN IV KIT
11.0000 | PACK | Freq: Once | INTRAVENOUS | Status: AC | PRN
  Administered 2012-04-01: 11 via INTRAVENOUS

## 2012-04-01 MED ORDER — REGADENOSON 0.4 MG/5ML IV SOLN
0.4000 mg | Freq: Once | INTRAVENOUS | Status: AC
  Administered 2012-04-01: 0.4 mg via INTRAVENOUS

## 2012-04-01 MED ORDER — TECHNETIUM TC 99M TETROFOSMIN IV KIT
33.0000 | PACK | Freq: Once | INTRAVENOUS | Status: AC | PRN
  Administered 2012-04-01: 33 via INTRAVENOUS

## 2012-04-01 NOTE — Progress Notes (Signed)
[facility] SITE 3 NUCLEAR MED [address] [place] [address] [phone]  Cardiology Nuclear Med Study  [name] is a 71 y.o. female     MRN : [id number]     DOB: 06/29/1941  Procedure Date: 04/01/2012  Nuclear Med Background Indication for Stress Test:  Evaluation for Ischemia and 03/30/12 Post Hospital: Chest pain, (-) enzymes History:  Approx 15 yrs ago Heart Catheterization: no report,normal per patient;'08 MPS:EF=75% and normal Cardiac Risk Factors: Family History - CAD, History of Smoking, Hypertension, IDDM Type 2 and Lipids  Symptoms: Chest Pain (last date of chest discomfort 2-3 days ago), Fatigue and Rapid HR   Nuclear Pre-Procedure Caffeine/Decaff Intake:  None NPO After: 6:00pm   Lungs:  clear O2 Sat: 96% on room air. IV 0.9% NS with Angio Cath:  22g  IV Site: R Wrist  IV Started by:  Stanton Kidney, EMT-P  Chest Size (in):  42 Cup Size: D  Height: 5\' 5"  (1.651 m)  Weight:  208 lb (94.348 kg)  BMI:  Body mass index is 34.61 kg/(m^2). Tech Comments:  Toprol held this morning, per patient.  CBG= 146 @ 7am, per patient.    Nuclear Med Study 1 or 2 day study: 1 day  Stress Test Type:  Eugenie Birks  Reading MD: Charlton Haws, MD  Order Authorizing Provider:  Effie Shy and Gaetano Net  Resting Radionuclide: Technetium 66m Tetrofosmin  Resting Radionuclide Dose: 11.0 mCi   Stress Radionuclide:  Technetium 13m Tetrofosmin  Stress Radionuclide Dose: 33.0 mCi           Stress Protocol Rest HR: 68 Stress HR: 95  Rest BP: 142/76 Stress BP: 170/78  Exercise Time (min): n/a METS: n/a   Predicted Max HR: 149 bpm % Max HR: 63.76 bpm Rate Pressure Product: 24401   Dose of Adenosine (mg):  n/a Dose of Lexiscan: 0.4 mg  Dose of Atropine (mg): n/a Dose of Dobutamine: n/a mcg/kg/min (at max HR)  Stress Test Technologist: Cathlyn Parsons, RN  Nuclear Technologist:  Doyne Keel, CNMT     Rest Procedure:  Myocardial perfusion imaging was  performed at rest 45 minutes following the intravenous administration of Technetium 24m Tetrofosmin. Rest ECG: Sinus Rhythm  Stress Procedure:  The patient received IV Lexiscan 0.4 mg over 15-seconds.  Patient had throat pain 4-5/10 with infusion.Technetium 56m Tetrofosmin injected at 30-seconds.  There were no significant changes with Lexiscan.  Quantitative spect images were obtained after a 45 minute delay. Stress ECG: No significant change from baseline ECG  QPS Raw Data Images:  Normal; no motion artifact; normal heart/lung ratio. Stress Images:  Normal homogeneous uptake in all areas of the myocardium. Rest Images:  Normal homogeneous uptake in all areas of the myocardium. Subtraction (SDS):  Normal Transient Ischemic Dilatation (Normal <1.22):  0.99 Lung/Heart Ratio (Normal <0.45):  0.30  Quantitative Gated Spect Images QGS EDV:  51 ml QGS ESV:  9 ml  Impression Exercise Capacity:  Lexiscan with no exercise. BP Response:  Normal blood pressure response. Clinical Symptoms:  Throat Pain ECG Impression:  No significant ST segment change suggestive of ischemia. Comparison with Prior Nuclear Study: No images to compare  Overall Impression:  Normal stress nuclear study.  LV Ejection Fraction: 82%.  LV Wall Motion:  NL LV Function; NL Wall Motion    Charlton Haws

## 2012-04-03 ENCOUNTER — Encounter: Payer: Self-pay | Admitting: Cardiology

## 2012-04-03 DIAGNOSIS — R079 Chest pain, unspecified: Secondary | ICD-10-CM | POA: Insufficient documentation

## 2012-04-03 NOTE — Discharge Summary (Signed)
Physician Discharge Summary  Patient ID: [name] MRN: [id number] DOB/AGE: 09-30-1940 71 y.o.  Admit date: 03/30/2012 Discharge date: 04/03/2012  Primary Care Physician:  Eartha Inch, MD   Discharge Diagnoses:    Principal Problem:  *Atypical chest pain Active Problems:  Hypertension  GERD (gastroesophageal reflux disease)  Hyperlipidemia  Arthritis  DM (diabetes mellitus), type 2, uncontrolled with complications    Medication List  As of 04/03/2012  7:38 AM   STOP taking these medications         omeprazole 20 MG capsule      simvastatin 40 MG tablet      sulfamethoxazole-trimethoprim 800-160 MG per tablet         TAKE these medications         aspirin 81 MG chewable tablet   Chew 162 mg by mouth once.      atorvastatin 20 MG tablet   Commonly known as: LIPITOR   Take 1 tablet (20 mg total) by mouth daily.      benazepril 40 MG tablet   Commonly known as: LOTENSIN   Take 40 mg by mouth daily.      cholecalciferol 1000 UNITS tablet   Commonly known as: VITAMIN D   Take 1,000 Units by mouth daily.      fenofibrate 160 MG tablet   Take 160 mg by mouth daily.      fluticasone 50 MCG/ACT nasal spray   Commonly known as: FLONASE   Place 2 sprays into the nose 2 (two) times daily as needed. allergies      furosemide 20 MG tablet   Commonly known as: LASIX   Take 20 mg by mouth daily. fluid      glimepiride 4 MG tablet   Commonly known as: AMARYL   Take 4 mg by mouth daily before breakfast.      metFORMIN 500 MG 24 hr tablet   Commonly known as: GLUCOPHAGE-XR   Take 500 mg by mouth daily with breakfast.      metoprolol succinate 100 MG 24 hr tablet   Commonly known as: TOPROL-XL   Take 150 mg by mouth daily. Pt takes 1.5 tab ( 150mg  dose)      nitroGLYCERIN 0.4 MG SL tablet   Commonly known as: NITROSTAT   Place 0.4 mg under the tongue once.      pantoprazole 40 MG tablet   Commonly known as: PROTONIX   Take 1 tablet (40 mg total) by  mouth daily.      potassium chloride SA 20 MEQ tablet   Commonly known as: K-DUR,KLOR-CON   Take 20 mEq by mouth 2 (two) times daily.             Disposition and Follow-up:  [facility] for [facility] 8/13  Consults:  Dr.Katz cardiology  Significant Diagnostic Studies:  No results found.  Brief H and P: [name] is a 71 y.o. Caucasian female with history of hypertension, GERD, diabetes, hyperlipidemia, and arthritis who presents with the above complaints. Patient reports that over the last week she's been having some intermittent left-sided chest pains. Chest pain is not associated with activity. Pain is worse laying down. Last night she had pain that went to the back and to the neck as a result she presented to the emergency department for further evaluation. Patient was given nitroglycerin by the EMS and her pain had improved. She feels like there is gas around the heart to is causing her pain. She denies  any recent fevers, chills, nausea or vomiting. Denies any shortness of breath. Denies any abdominal pain or diarrhea. Chronically has daily headaches. Denies any vision changes. Patient does admit to sleeping on her left side as she has a bad right shoulder. Patient also admits to having some stress at home, as her granddaughter wants to go to college, however her son does not want her to go to college. Given her history of hypertension and diabetes the hospitalist service was asked to admit the patient for further care and management.   Hospital Course:  1. Atypical chest pain: with some typical features, resolved  EKG normal, cardiac enzymes x 2 negative, could be musculoskeletal or GI in origin too  Reports normal cardiac cath in 2008 by Dr.Brodie, do not see this in our system  Has multiple cardiac risk factors, DM/HTN/Dyslipdemia and family history  Seen by [facility] and scheduled for outpatient myoview 8/13 Increase PPI to BID  2. DM: continue glimepiride, SSI   Check Hbaic, requests dietary consult  3. HTN: stable, continue benazepril and metoprolol  4. Dyslipidemia: continue fenofibrate, LDL 119- advised to start statin, patient reluctant due to h/o myalgias myopathy hence advised to FU with PCP and discuss this 5. Obesity: lifestyle modification  6. GERD: PPI change to BID   Time spent on Discharge:  Signed: Adell Koval [facility]  04/03/2012, 7:38 AM

## 2012-04-04 ENCOUNTER — Telehealth: Payer: Self-pay | Admitting: Nurse Practitioner

## 2012-04-04 NOTE — Telephone Encounter (Signed)
New problem:  Patient calling for test results  

## 2012-04-04 NOTE — Telephone Encounter (Signed)
Results of stress test given to pt

## 2012-04-08 ENCOUNTER — Telehealth: Payer: Self-pay | Admitting: Nurse Practitioner

## 2012-04-08 ENCOUNTER — Ambulatory Visit (INDEPENDENT_AMBULATORY_CARE_PROVIDER_SITE_OTHER): Payer: Medicare Other | Admitting: Nurse Practitioner

## 2012-04-08 ENCOUNTER — Encounter: Payer: Self-pay | Admitting: Nurse Practitioner

## 2012-04-08 VITALS — BP 140/82 | HR 86 | Ht 63.0 in | Wt 207.8 lb

## 2012-04-08 DIAGNOSIS — I10 Essential (primary) hypertension: Secondary | ICD-10-CM

## 2012-04-08 DIAGNOSIS — R079 Chest pain, unspecified: Secondary | ICD-10-CM

## 2012-04-08 DIAGNOSIS — E785 Hyperlipidemia, unspecified: Secondary | ICD-10-CM

## 2012-04-08 MED ORDER — NITROGLYCERIN 0.4 MG SL SUBL: 0.4000 mg | SUBLINGUAL_TABLET | Freq: Once | SUBLINGUAL | Status: DC

## 2012-04-08 MED ORDER — AMLODIPINE BESYLATE 5 MG PO TABS: 5.0000 mg | ORAL_TABLET | Freq: Every day | ORAL | Status: DC

## 2012-04-08 MED ORDER — PRAVASTATIN SODIUM 40 MG PO TABS: 40.0000 mg | ORAL_TABLET | Freq: Every evening | ORAL | Status: DC

## 2012-04-08 NOTE — Progress Notes (Signed)
[name] Date of Birth: 16-Jun-1941 Medical Record #[id number]  History of Present Illness: [name] is seen back today for a post hospital visit. She is seen for Dr. Myrtis Ser. She was recently admitted with atypical chest pain. Had a normal outpatient Myoview. She was given PPI therapy. Her other problems include obesity, HTN, GERD, HLD, DM and OA.   She comes in today. She is here alone. She seems to be feeling better. She has one "sore" place on her left breast. It hurts to touch. No symptoms like what led to her admission. She is under a lot of stress with her granddaughter. Her granddaughter lives with her and has turned 89 and graduated high school. The girl's father will not sign for her to go to college. This is causing [name] much stress and grief. Blood pressure is up. She does not tolerate Lipitor due to myalgias. She sees Dr. Cyndia Bent for primary care.   Current Outpatient Prescriptions on File Prior to Visit  Medication Sig Dispense Refill  . benazepril (LOTENSIN) 40 MG tablet Take 40 mg by mouth daily.        . cholecalciferol (VITAMIN D) 1000 UNITS tablet Take 1,000 Units by mouth daily.        . fenofibrate 160 MG tablet Take 160 mg by mouth daily.      . fluticasone (FLONASE) 50 MCG/ACT nasal spray Place 2 sprays into the nose 2 (two) times daily as needed. allergies       . furosemide (LASIX) 20 MG tablet Take 20 mg by mouth daily. fluid      . glimepiride (AMARYL) 4 MG tablet Take 4 mg by mouth daily before breakfast.        . metFORMIN (GLUCOPHAGE-XR) 500 MG 24 hr tablet Take 500 mg by mouth daily with breakfast.      . metoprolol (TOPROL-XL) 100 MG 24 hr tablet Take 150 mg by mouth daily. Pt takes 1.5 tab ( 150mg  dose)      . pantoprazole (PROTONIX) 40 MG tablet Take 1 tablet (40 mg total) by mouth daily.  30 tablet  0  . potassium chloride SA (K-DUR,KLOR-CON) 20 MEQ tablet Take 20 mEq by mouth 2 (two) times daily.        Marland Kitchen amLODipine (NORVASC) 5 MG tablet Take 1 tablet  (5 mg total) by mouth daily.  30 tablet  11  . aspirin 81 MG chewable tablet Chew 162 mg by mouth once.      . pravastatin (PRAVACHOL) 40 MG tablet Take 1 tablet (40 mg total) by mouth every evening.  30 tablet  11  . DISCONTD: nitroGLYCERIN (NITROSTAT) 0.4 MG SL tablet Place 0.4 mg under the tongue once.        Allergies  Allergen Reactions  . Atorvastatin   . Darvocet (Propoxyphene-Acetaminophen)     Past Medical History  Diagnosis Date  . Hypertension   . GERD (gastroesophageal reflux disease)   . Diabetes mellitus   . Hyperlipidemia   . Arthritis   . Chest pain     Hospital 03/2012  // outpatient nuclear normal 04/02/2012  EF  80%  . Obesity   . Stress At Home     Past Surgical History  Procedure Date  . Shoulder surgery   . Back surgery   . Abdominal hysterectomy     History  Smoking status  . Former Smoker -- 0.3 packs/day for 10 years  . Types: Cigarettes  . Quit date: 03/30/1972  Smokeless tobacco  .  Not on file  Comment: Quit 40 years ago    History  Alcohol Use No    Family History  Problem Relation Age of Onset  . Heart failure Mother   . Heart attack Father     Review of Systems: The review of systems is per the HPI.  All other systems were reviewed and are negative.  Physical Exam: BP 140/82  Pulse 86  Ht 5\' 3"  (1.6 m)  Wt 207 lb 12.8 oz (94.257 kg)  BMI 36.81 kg/m2 Patient is very pleasant and in no acute distress. She is obese. Skin is warm and dry. Color is normal.  HEENT is unremarkable. Normocephalic/atraumatic. PERRL. Sclera are nonicteric. Neck is supple. No masses. No JVD. Lungs are clear. Cardiac exam shows a regular rate and rhythm. Abdomen is obese but soft. Extremities are without edema. Gait and ROM are intact. No gross neurologic deficits noted.   LABORATORY DATA:  Myoview Overall Impression:  Normal stress nuclear study.  LV Ejection Fraction: 82%. LV Wall Motion: NL LV Function; NL Wall Motion   Charlton Haws  Lab  Results  Component Value Date   WBC 6.1 03/31/2012   HGB 13.5 03/31/2012   HCT 40.8 03/31/2012   PLT 266 03/31/2012   GLUCOSE 147* 03/31/2012   CHOL 224* 03/31/2012   TRIG 277* 03/31/2012   HDL 50 03/31/2012   LDLCALC 119* 03/31/2012   NA 136 03/31/2012   K 4.6 03/31/2012   CL 100 03/31/2012   CREATININE 0.86 03/31/2012   BUN 15 03/31/2012   CO2 26 03/31/2012   INR 1.1 01/22/2007   HGBA1C 7.4* 03/31/2012    Assessment / Plan:  1. Chest pain - Seems improved with PPI theapy. Needs risk factor modification. Most of her issues seem related to her stress with her granddaughter and we talked about that in depth today.   2. HTN - BP is up. I have added Norvasc 5 mg daily. I will see her back in 6 weeks to recheck.   3. HLD - She did not tolerate Lipitor. She is willing to take another statin. Will try her on Pravachol 40 mg. When I see her back, will check fasting labs.   4. Obesity - We did discuss ways to cut back on her calories.   Patient is agreeable to this plan and will call if any problems develop in the interim.

## 2012-04-08 NOTE — Patient Instructions (Addendum)
I would take a baby aspirin daily   I have refilled your NTG today  I have added Pravachol 40 mg daily for your cholesterol  I have added Norvasc 5 mg daily for your blood pressure  I want to see you in 6 weeks with fasting labs (CMET/Lipids)  Here are my tips to lose weight:  1. Drink only water. You do not need milk, juice, tea, soda or diet soda.  2. Do not eat anything "white". This includes white bread, potatoes, rice or mayo  3. Stay away from fried foods and sweets  4. Your portion should be the size of the palm of your hand.  5. Know what your weaknesses are and avoid.  6. Find an exercise you like and do it every day for 45 to 60 minutes.        Stay on your current medicines  Call the [facility] office at [phone] if you have any questions, problems or concerns.

## 2012-04-08 NOTE — Telephone Encounter (Signed)
Patient returning nurse call she can be reached at [phone]. seen today by Lawson Fiscal G>

## 2012-04-08 NOTE — Telephone Encounter (Signed)
No note seen that we called her, had ov today -note reviewed. Pt states received a call from Diane / no employee here by that name and  call back number she repeated back was not ours, told her they will call back. Pt agreed to plan.

## 2012-05-23 ENCOUNTER — Other Ambulatory Visit: Payer: Self-pay | Admitting: *Deleted

## 2012-05-23 DIAGNOSIS — E118 Type 2 diabetes mellitus with unspecified complications: Secondary | ICD-10-CM

## 2012-05-23 DIAGNOSIS — E785 Hyperlipidemia, unspecified: Secondary | ICD-10-CM

## 2012-05-23 DIAGNOSIS — I10 Essential (primary) hypertension: Secondary | ICD-10-CM

## 2012-05-26 ENCOUNTER — Encounter: Payer: Self-pay | Admitting: Nurse Practitioner

## 2012-05-26 ENCOUNTER — Other Ambulatory Visit (INDEPENDENT_AMBULATORY_CARE_PROVIDER_SITE_OTHER): Payer: Medicare Other

## 2012-05-26 ENCOUNTER — Ambulatory Visit (INDEPENDENT_AMBULATORY_CARE_PROVIDER_SITE_OTHER): Payer: Medicare Other | Admitting: Nurse Practitioner

## 2012-05-26 ENCOUNTER — Telehealth: Payer: Self-pay | Admitting: *Deleted

## 2012-05-26 ENCOUNTER — Telehealth: Payer: Self-pay | Admitting: Cardiology

## 2012-05-26 VITALS — BP 134/78 | HR 77 | Ht 63.0 in | Wt 206.0 lb

## 2012-05-26 DIAGNOSIS — R0989 Other specified symptoms and signs involving the circulatory and respiratory systems: Secondary | ICD-10-CM

## 2012-05-26 DIAGNOSIS — I10 Essential (primary) hypertension: Secondary | ICD-10-CM

## 2012-05-26 DIAGNOSIS — E118 Type 2 diabetes mellitus with unspecified complications: Secondary | ICD-10-CM

## 2012-05-26 DIAGNOSIS — E1165 Type 2 diabetes mellitus with hyperglycemia: Secondary | ICD-10-CM

## 2012-05-26 DIAGNOSIS — E785 Hyperlipidemia, unspecified: Secondary | ICD-10-CM

## 2012-05-26 LAB — BASIC METABOLIC PANEL
BUN: 23 mg/dL (ref 6–23)
CO2: 28 mEq/L (ref 19–32)
Calcium: 9.3 mg/dL (ref 8.4–10.5)
Chloride: 101 mEq/L (ref 96–112)
Creatinine, Ser: 1 mg/dL (ref 0.4–1.2)
GFR: 58.59 mL/min — ABNORMAL LOW (ref >60.00)
Glucose, Bld: 146 mg/dL — ABNORMAL HIGH (ref 70–99)
Potassium: 4.3 mEq/L (ref 3.5–5.1)
Sodium: 138 mEq/L (ref 135–145)

## 2012-05-26 LAB — HEPATIC FUNCTION PANEL
ALT: 20 U/L (ref 0–35)
AST: 25 U/L (ref 0–37)
Albumin: 3.5 g/dL (ref 3.5–5.2)
Alkaline Phosphatase: 45 U/L (ref 39–117)
Bilirubin, Direct: 0.1 mg/dL (ref 0.0–0.3)
Total Bilirubin: 0.6 mg/dL (ref 0.3–1.2)
Total Protein: 7.3 g/dL (ref 6.0–8.3)

## 2012-05-26 LAB — LDL CHOLESTEROL, DIRECT: Direct LDL: 168.3 mg/dL

## 2012-05-26 LAB — LIPID PANEL
Cholesterol: 258 mg/dL — ABNORMAL HIGH (ref 0–200)
HDL: 37 mg/dL — ABNORMAL LOW (ref >39.00)
Total CHOL/HDL Ratio: 7
Triglycerides: 303 mg/dL — ABNORMAL HIGH (ref 0.0–149.0)
VLDL: 60.6 mg/dL — ABNORMAL HIGH (ref 0.0–40.0)

## 2012-05-26 NOTE — Progress Notes (Signed)
[name] Date of Birth: Aug 06, 1941 Medical Record #[id number]  History of Present Illness: [name] is seen back today for a 6 week check. She is seen for Dr. Myrtis Ser. She had a recent admission for atypical chest pain. Follow up Myoview was satisfactory. She was given PPI therpay. Other problems include HTN, GERD, HLD, DM and OA. We started Norvasc at her last visit due to elevated blood pressure. Lipitor was changed to Pravachol due to myalgias.   She comes in today. She is here alone. She is doing ok. Not able to check her blood pressure at home. Her monitor is broke. But she feels pretty good. No more chest pain. Remains pretty upset about her grand daughter's situation. Tolerating her medicines. Feels ok on the Pravachol. No swelling. Not short of breath. Overall, she feels like she is doing ok.   Current Outpatient Prescriptions on File Prior to Visit  Medication Sig Dispense Refill  . amLODipine (NORVASC) 5 MG tablet Take 1 tablet (5 mg total) by mouth daily.  30 tablet  11  . aspirin 81 MG chewable tablet Chew 162 mg by mouth once.      . benazepril (LOTENSIN) 40 MG tablet Take 40 mg by mouth daily.        . cholecalciferol (VITAMIN D) 1000 UNITS tablet Take 1,000 Units by mouth daily.        . fenofibrate 160 MG tablet Take 160 mg by mouth daily.      . fluticasone (FLONASE) 50 MCG/ACT nasal spray Place 2 sprays into the nose 2 (two) times daily as needed. allergies       . furosemide (LASIX) 20 MG tablet Take 20 mg by mouth daily. fluid      . glimepiride (AMARYL) 4 MG tablet Take 4 mg by mouth daily before breakfast.        . metFORMIN (GLUCOPHAGE-XR) 500 MG 24 hr tablet Take 500 mg by mouth daily with breakfast.      . metoprolol (TOPROL-XL) 100 MG 24 hr tablet Take 150 mg by mouth daily. Pt takes 1.5 tab ( 150mg  dose)      . nitroGLYCERIN (NITROSTAT) 0.4 MG SL tablet Place 1 tablet (0.4 mg total) under the tongue once.  25 tablet  6  . pantoprazole (PROTONIX) 40 MG tablet  Take 1 tablet (40 mg total) by mouth daily.  30 tablet  0  . potassium chloride SA (K-DUR,KLOR-CON) 20 MEQ tablet Take 20 mEq by mouth 2 (two) times daily.        . pravastatin (PRAVACHOL) 40 MG tablet Take 1 tablet (40 mg total) by mouth every evening.  30 tablet  11    Allergies  Allergen Reactions  . Atorvastatin   . Darvocet (Propoxyphene-Acetaminophen)     Past Medical History  Diagnosis Date  . Hypertension   . GERD (gastroesophageal reflux disease)   . Diabetes mellitus   . Hyperlipidemia   . Arthritis   . Chest pain     Hospital 03/2012  // outpatient nuclear normal 04/02/2012  EF  80%  . Obesity   . Stress at home     Past Surgical History  Procedure Date  . Shoulder surgery   . Back surgery   . Abdominal hysterectomy     History  Smoking status  . Former Smoker -- 0.3 packs/day for 10 years  . Types: Cigarettes  . Quit date: 03/30/1972  Smokeless tobacco  . Not on file  Comment: Quit 40 years ago  History  Alcohol Use No    Family History  Problem Relation Age of Onset  . Heart failure Mother   . Heart attack Father     Review of Systems: The review of systems is per the HPI.  All other systems were reviewed and are negative.  Physical Exam: BP 134/78  Pulse 77  Ht 5\' 3"  (1.6 m)  Wt 206 lb (93.441 kg)  BMI 36.49 kg/m2 Patient is very pleasant and in no acute distress. Skin is warm and dry. Color is normal.  HEENT is unremarkable. Normocephalic/atraumatic. PERRL. Sclera are nonicteric. Neck is supple. No masses. No JVD. Lungs are clear. Cardiac exam shows a regular rate and rhythm. Abdomen is soft. Extremities are without edema. Gait and ROM are intact. No gross neurologic deficits noted.  LABORATORY DATA: Pending for today.   Lab Results  Component Value Date   WBC 6.1 03/31/2012   HGB 13.5 03/31/2012   HCT 40.8 03/31/2012   PLT 266 03/31/2012   GLUCOSE 147* 03/31/2012   CHOL 224* 03/31/2012   TRIG 277* 03/31/2012   HDL 50 03/31/2012    LDLCALC 119* 03/31/2012   NA 136 03/31/2012   K 4.6 03/31/2012   CL 100 03/31/2012   CREATININE 0.86 03/31/2012   BUN 15 03/31/2012   CO2 26 03/31/2012   INR 1.1 01/22/2007   HGBA1C 7.4* 03/31/2012    Assessment / Plan: 1. HTN - recently added Norvasc. BP looks better. Will keep her on her current regimen.  2. HLD - switched to Pravachol. Will recheck lipids today.   3. Atypical chest pain - negative Myoview - symptoms resolved with PPI therapy.   Overall, she is felt to be doing ok. I will have her see Dr. Myrtis Ser in about 6 months. Labs will be checked today. Samples of aspirin were given today. Patient is agreeable to this plan and will call if any problems develop in the interim.

## 2012-05-26 NOTE — Patient Instructions (Addendum)
Stay on your current medicines  We are checking labs today.  Dr. Myrtis Ser will see you back in 6 months.

## 2012-05-26 NOTE — Telephone Encounter (Signed)
Message copied by Awilda Bill on Mon May 26, 2012  4:10 PM ------      Message from: Rosalio Macadamia      Created: Mon May 26, 2012  3:11 PM       Ok to report. Labs are satisfactory except lipids are high. Is she taking the Pravachol 40 mg? Needs to work on decreasing the sugars in her diet. Needs better glucose control. Send copy to her PCP.

## 2012-05-26 NOTE — Telephone Encounter (Signed)
Message copied by Awilda Bill on Mon May 26, 2012  4:14 PM ------      Message from: Rosalio Macadamia      Created: Mon May 26, 2012  3:11 PM       Ok to report. Labs are satisfactory except lipids are high. Is she taking the Pravachol 40 mg? Needs to work on decreasing the sugars in her diet. Needs better glucose control. Send copy to her PCP.

## 2012-05-26 NOTE — Telephone Encounter (Signed)
New Problem:    Patient returned your call regarding her latest lab work.  Please call back.

## 2012-05-26 NOTE — Telephone Encounter (Signed)
Pt notified of lab results.  Amanda Becker, CMA 

## 2012-05-27 ENCOUNTER — Telehealth: Payer: Self-pay | Admitting: Cardiology

## 2012-05-27 NOTE — Telephone Encounter (Signed)
Called patient back to discuss medications.  She wanted to know how to take Pravastatin and protonix, and what they were for.  I answered all questions.  Pt expressed understanding.  Vista Mink, CMA

## 2012-05-27 NOTE — Telephone Encounter (Signed)
New problem:  Seen on yesterday need to discuss medication

## 2012-05-27 NOTE — Telephone Encounter (Signed)
New Problem:    Patient called in to discuss her medications.  Please call back.

## 2012-05-27 NOTE — Telephone Encounter (Signed)
Discussed in detail patients lab results for Lipids.  Pt aware of results and recommendation by Lawson Fiscal for diet.  Vista Mink, CMA

## 2013-04-20 ENCOUNTER — Other Ambulatory Visit: Payer: Self-pay | Admitting: Nurse Practitioner

## 2013-06-16 ENCOUNTER — Other Ambulatory Visit: Payer: Self-pay | Admitting: Cardiology

## 2013-06-29 ENCOUNTER — Other Ambulatory Visit: Payer: Self-pay | Admitting: Cardiology

## 2013-09-14 ENCOUNTER — Other Ambulatory Visit: Payer: Self-pay | Admitting: Cardiology

## 2013-10-01 ENCOUNTER — Other Ambulatory Visit: Payer: Self-pay | Admitting: Cardiology

## 2013-10-08 IMAGING — CR DG CHEST 2V
2 series · 2 of 2 positions shown · non-contrast
Comparison: 07/14/2011

CLINICAL DATA: Chest pain

CHEST - 2 VIEW

[w chest pa]
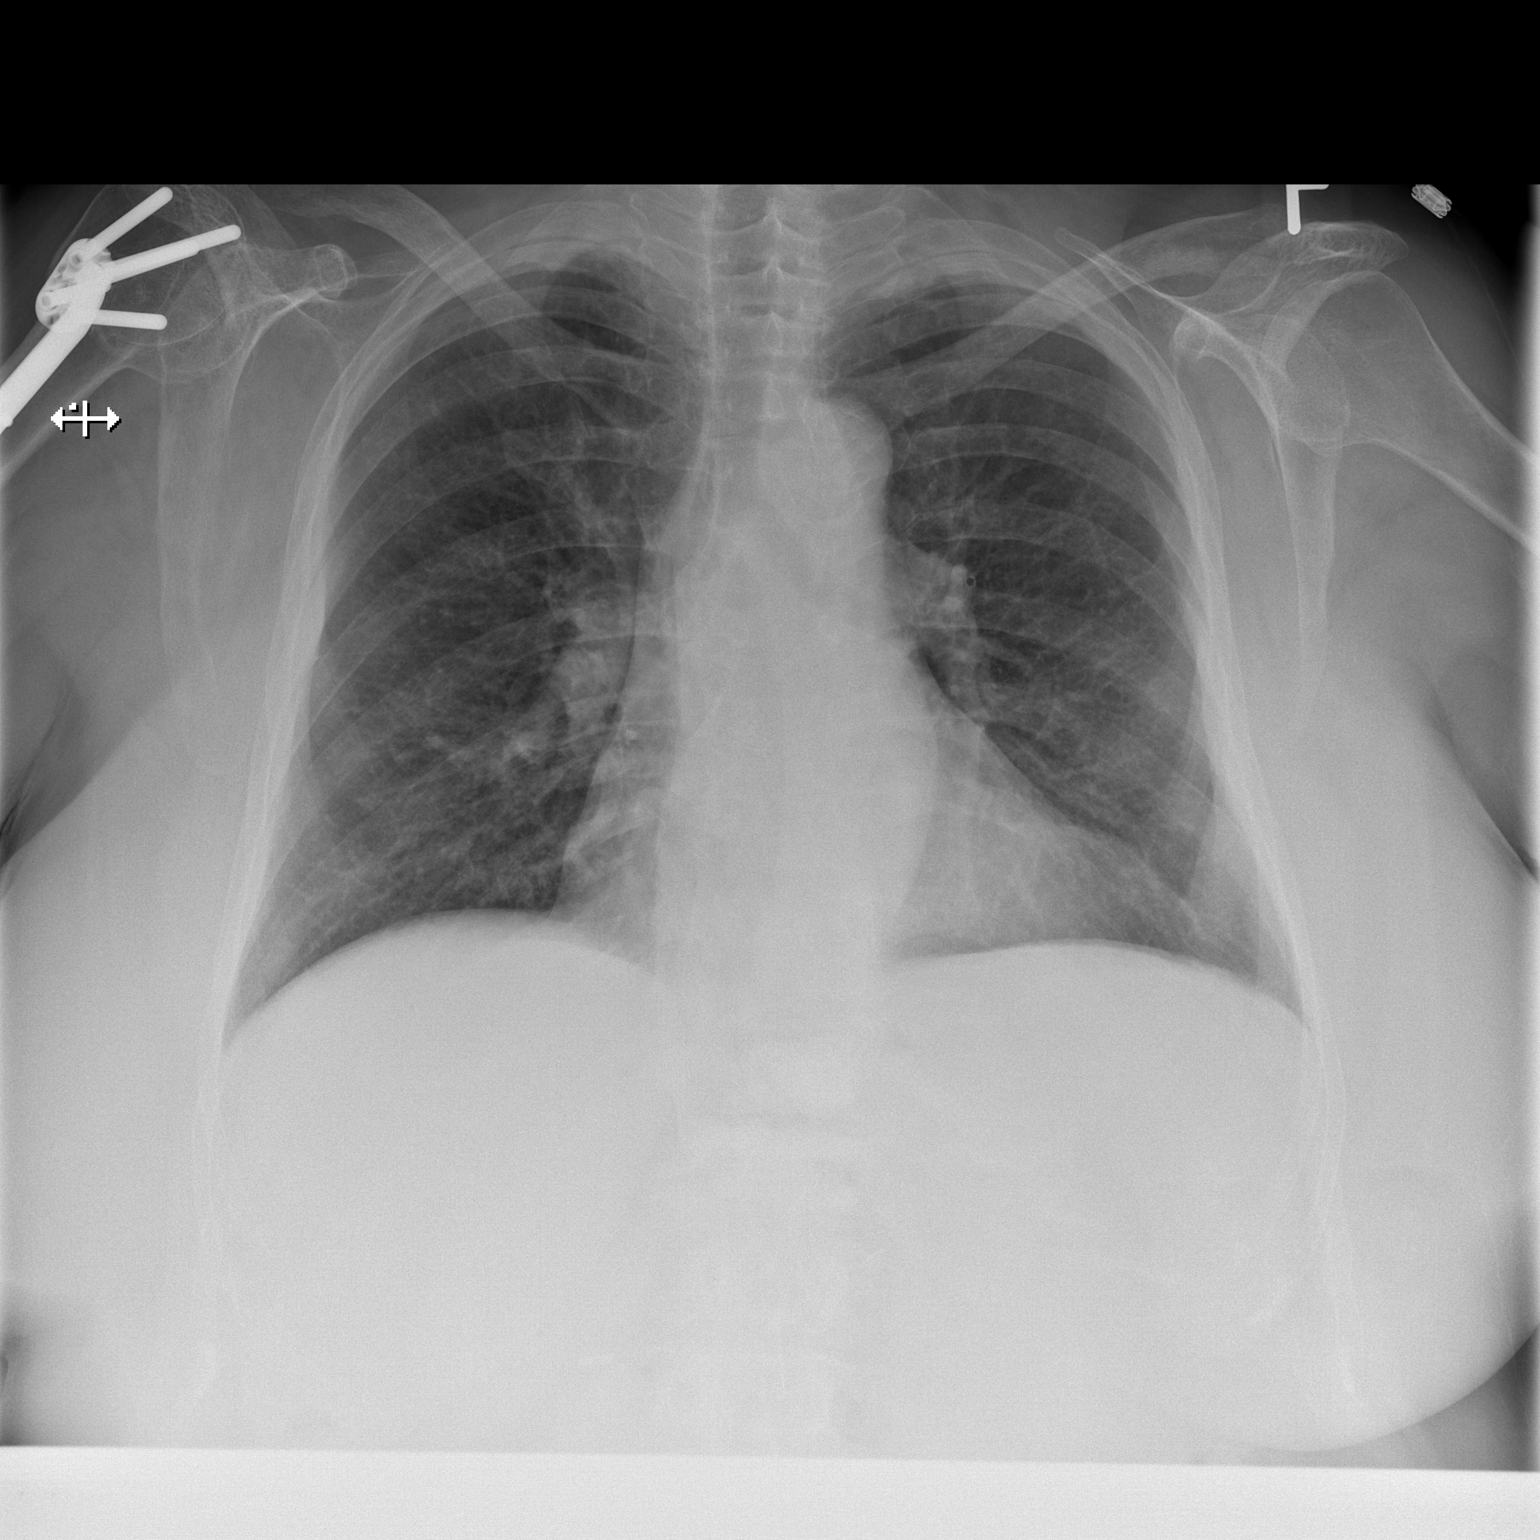

[w chest lat]
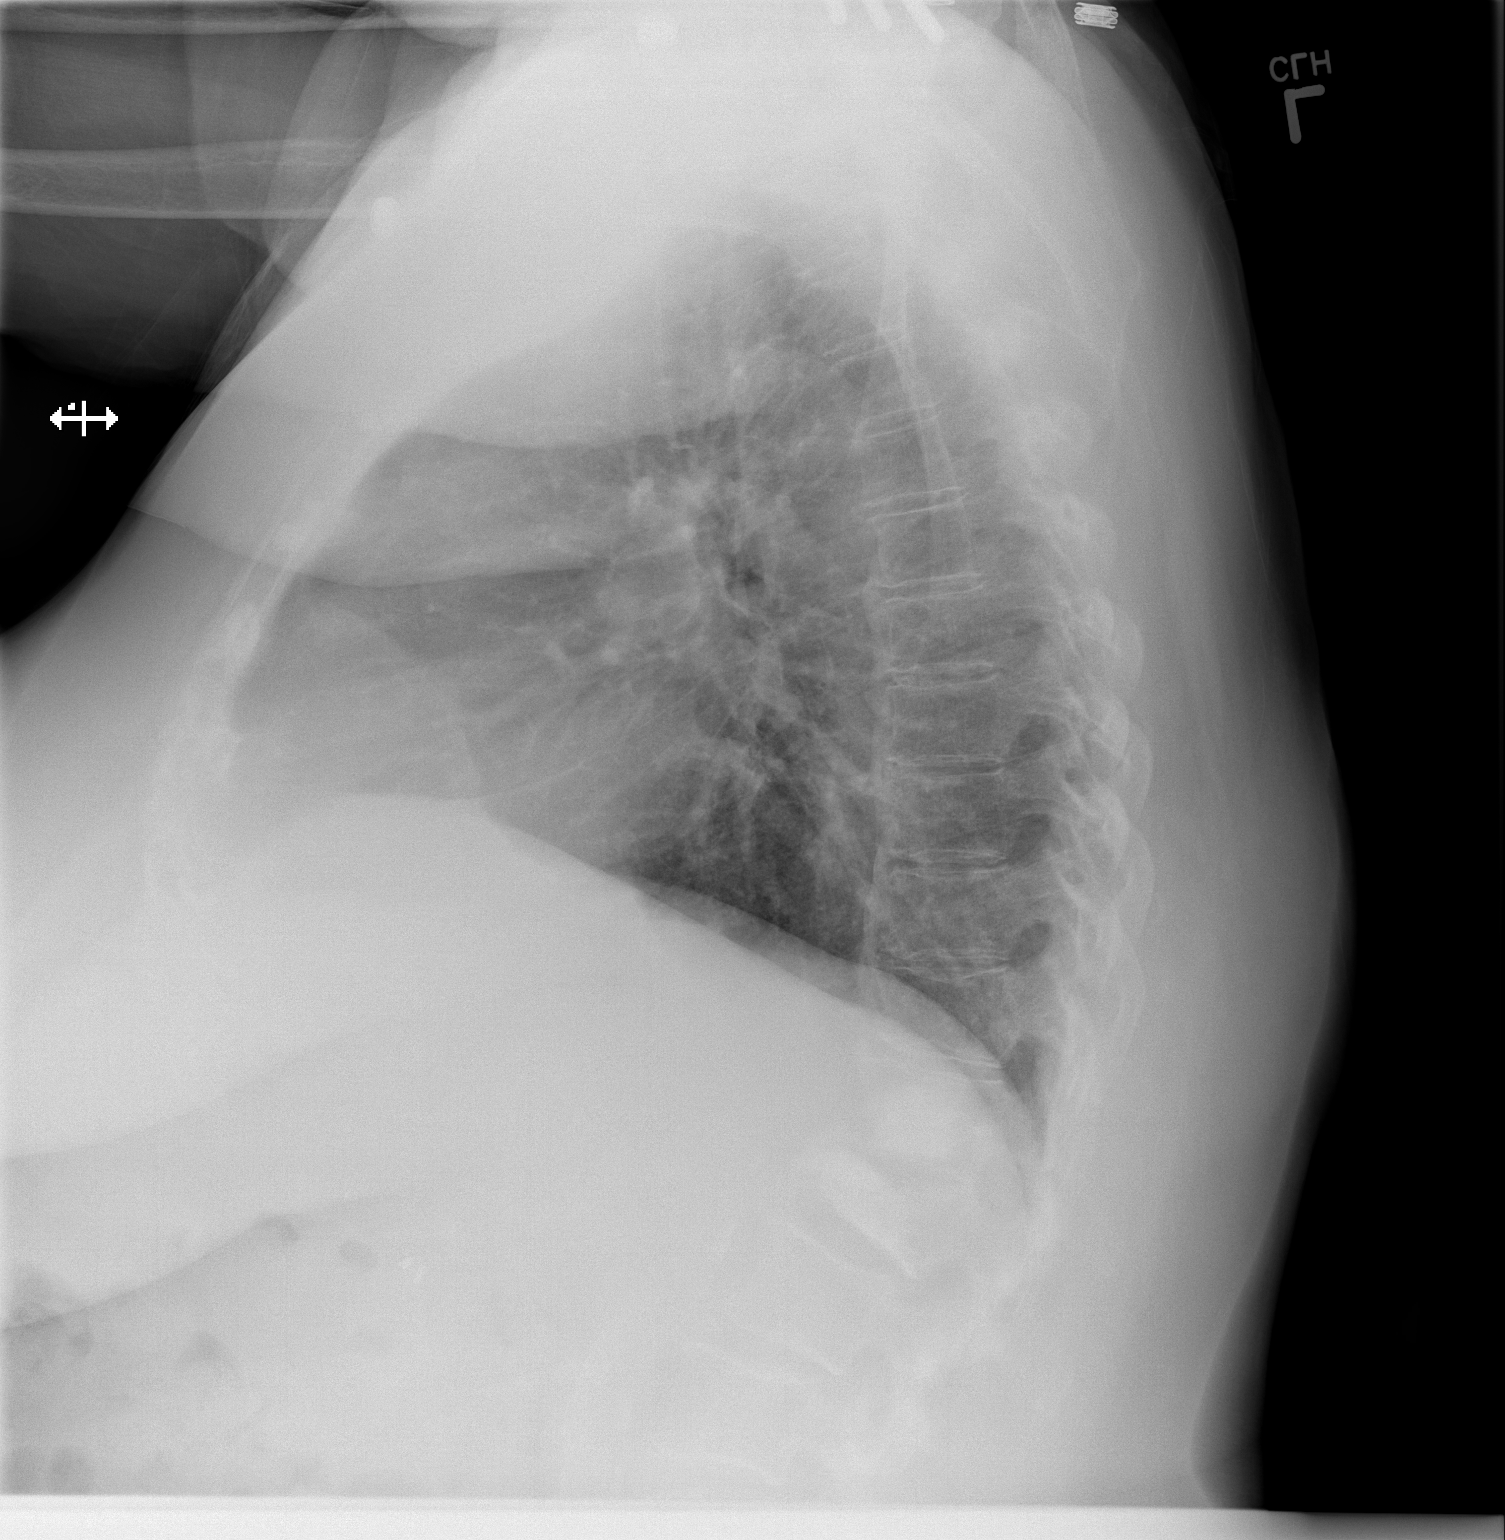

[2 of 2 positions shown; findings below may reference images not displayed]

FINDINGS: Heart size upper normal, similar to prior.  No focal
consolidation, pleural effusion, or pneumothorax.  Surgical
hardware right humerus, incompletely imaged. Unchanged compression
deformities post vertebroplasty. Flowing anterior osteophytes.  No
acute osseous finding. Surgical clips right upper quadrant.
IMPRESSION: No radiographic evidence of acute cardiopulmonary process.

## 2015-01-16 ENCOUNTER — Emergency Department (HOSPITAL_COMMUNITY): Payer: Medicare HMO

## 2015-01-16 ENCOUNTER — Emergency Department (HOSPITAL_COMMUNITY)
Admission: EM | Admit: 2015-01-16 | Discharge: 2015-01-17 | Disposition: A | Discharge status: Home / Self Care | Payer: Medicare HMO | Attending: Emergency Medicine | Admitting: Emergency Medicine

## 2015-01-16 ENCOUNTER — Encounter (HOSPITAL_COMMUNITY): Payer: Self-pay | Admitting: Emergency Medicine

## 2015-01-16 DIAGNOSIS — E785 Hyperlipidemia, unspecified: Secondary | ICD-10-CM | POA: Diagnosis not present

## 2015-01-16 DIAGNOSIS — I10 Essential (primary) hypertension: Secondary | ICD-10-CM | POA: Insufficient documentation

## 2015-01-16 DIAGNOSIS — E119 Type 2 diabetes mellitus without complications: Secondary | ICD-10-CM | POA: Diagnosis not present

## 2015-01-16 DIAGNOSIS — E669 Obesity, unspecified: Secondary | ICD-10-CM | POA: Diagnosis not present

## 2015-01-16 DIAGNOSIS — Z79899 Other long term (current) drug therapy: Secondary | ICD-10-CM | POA: Insufficient documentation

## 2015-01-16 DIAGNOSIS — M1990 Unspecified osteoarthritis, unspecified site: Secondary | ICD-10-CM | POA: Diagnosis not present

## 2015-01-16 DIAGNOSIS — R1084 Generalized abdominal pain: Secondary | ICD-10-CM

## 2015-01-16 DIAGNOSIS — K219 Gastro-esophageal reflux disease without esophagitis: Secondary | ICD-10-CM | POA: Insufficient documentation

## 2015-01-16 DIAGNOSIS — R0602 Shortness of breath: Secondary | ICD-10-CM

## 2015-01-16 DIAGNOSIS — Z7982 Long term (current) use of aspirin: Secondary | ICD-10-CM | POA: Diagnosis not present

## 2015-01-16 DIAGNOSIS — Z87891 Personal history of nicotine dependence: Secondary | ICD-10-CM | POA: Insufficient documentation

## 2015-01-16 LAB — CBC WITH DIFFERENTIAL/PLATELET
BASOS ABS: 0 10*3/uL (ref 0.0–0.1)
BASOS PCT: 0 % (ref 0–1)
Eosinophils Absolute: 0.1 10*3/uL (ref 0.0–0.7)
Eosinophils Relative: 1 % (ref 0–5)
HEMATOCRIT: 35.3 % — AB (ref 36.0–46.0)
Hemoglobin: 11.6 g/dL — ABNORMAL LOW (ref 12.0–15.0)
Lymphocytes Relative: 9 % — ABNORMAL LOW (ref 12–46)
Lymphs Abs: 1.5 10*3/uL (ref 0.7–4.0)
MCH: 32 pg (ref 26.0–34.0)
MCHC: 32.9 g/dL (ref 30.0–36.0)
MCV: 97.5 fL (ref 78.0–100.0)
Monocytes Absolute: 1.1 10*3/uL — ABNORMAL HIGH (ref 0.1–1.0)
Monocytes Relative: 7 % (ref 3–12)
Neutro Abs: 13.6 10*3/uL — ABNORMAL HIGH (ref 1.7–7.7)
Neutrophils Relative %: 83 % — ABNORMAL HIGH (ref 43–77)
Platelets: 336 10*3/uL (ref 150–400)
RBC: 3.62 MIL/uL — AB (ref 3.87–5.11)
RDW: 14.1 % (ref 11.5–15.5)
WBC: 16.2 10*3/uL — ABNORMAL HIGH (ref 4.0–10.5)

## 2015-01-16 LAB — COMPREHENSIVE METABOLIC PANEL
ALT: 13 U/L — ABNORMAL LOW (ref 14–54)
ANION GAP: 12 (ref 5–15)
AST: 23 U/L (ref 15–41)
Albumin: 3.1 g/dL — ABNORMAL LOW (ref 3.5–5.0)
Alkaline Phosphatase: 58 U/L (ref 38–126)
BILIRUBIN TOTAL: 0.6 mg/dL (ref 0.3–1.2)
BUN: 11 mg/dL (ref 6–20)
CALCIUM: 8.9 mg/dL (ref 8.9–10.3)
CHLORIDE: 100 mmol/L — AB (ref 101–111)
CO2: 25 mmol/L (ref 22–32)
Creatinine, Ser: 0.91 mg/dL (ref 0.44–1.00)
Glucose, Bld: 201 mg/dL — ABNORMAL HIGH (ref 65–99)
Potassium: 3.6 mmol/L (ref 3.5–5.1)
Sodium: 137 mmol/L (ref 135–145)
Total Protein: 6.6 g/dL (ref 6.5–8.1)

## 2015-01-16 LAB — BRAIN NATRIURETIC PEPTIDE: B Natriuretic Peptide: 222.5 pg/mL — ABNORMAL HIGH (ref 0.0–100.0)

## 2015-01-16 MED ORDER — IOHEXOL 300 MG/ML  SOLN
25.0000 mL | Freq: Once | INTRAMUSCULAR | Status: AC | PRN
  Administered 2015-01-16: 25 mL via ORAL

## 2015-01-16 MED ORDER — SODIUM CHLORIDE 0.9 % IV BOLUS (SEPSIS)
1000.0000 mL | Freq: Once | INTRAVENOUS | Status: AC
  Administered 2015-01-16: 1000 mL via INTRAVENOUS

## 2015-01-16 MED ORDER — IOHEXOL 300 MG/ML  SOLN: 100.0000 mL | Freq: Once | INTRAMUSCULAR | Status: AC | PRN

## 2015-01-16 MED ORDER — ONDANSETRON HCL 4 MG/2ML IJ SOLN
4.0000 mg | Freq: Once | INTRAMUSCULAR | Status: AC
  Administered 2015-01-16: 4 mg via INTRAVENOUS
  Filled 2015-01-16: qty 2

## 2015-01-16 NOTE — ED Provider Notes (Signed)
CSN: [id number]     Arrival date & time 01/16/15  2131 History   First MD Initiated Contact with Patient 01/16/15 2158     Chief Complaint  Patient presents with  . Edema     (Consider location/radiation/quality/duration/timing/severity/associated sxs/prior Treatment) HPI   74 year old obese female with history of hypertension, diabetes, GERD, hyperlipidemia presenting for evaluation of abdominal pain. The patient states for the past week she has had persistent diffuse abdominal pain. She described pain as an crampy sensation worsening with movement. She endorsed nausea, vomiting, and feeling constipated. Last bowel movement was last night after taking Alka-Seltzer. She endorses subjective fever and chills. Patient states she had a mechanical fall week, landed on her Abdomen but denies any significant pain at that time. Patient denies having headache, neck stiffness, chest pain, shortness of breath, productive cough, mouth cyst, back pain, dysuria, hematuria, hematochezia melanoma. Patient lives at home by herself.  Past Medical History  Diagnosis Date  . Hypertension   . GERD (gastroesophageal reflux disease)   . Diabetes mellitus   . Hyperlipidemia   . Arthritis   . Chest pain     Hospital 03/2012  // outpatient nuclear normal 04/02/2012  EF  80%  . Obesity   . Stress at home    Past Surgical History  Procedure Laterality Date  . Shoulder surgery    . Back surgery    . Abdominal hysterectomy     Family History  Problem Relation Age of Onset  . Heart failure Mother   . Heart attack Father    History  Substance Use Topics  . Smoking status: Former Smoker -- 0.30 packs/day for 0 years    Types: Cigarettes    Quit date: 03/30/1972  . Smokeless tobacco: Not on file     Comment: Quit 40 years ago  . Alcohol Use: No   OB History    No data available     Review of Systems  All other systems reviewed and are negative.     Allergies  Atorvastatin and Darvocet  Home  Medications   Prior to Admission medications   Medication Sig Start Date End Date Taking? Authorizing Provider  amLODipine (NORVASC) 5 MG tablet TAKE 1 TABLET (5 MG TOTAL) BY MOUTH DAILY. 06/16/13   Luis Abed, MD  amLODipine (NORVASC) 5 MG tablet TAKE 1 TABLET (5 MG TOTAL) BY MOUTH DAILY. 09/14/13   Luis Abed, MD  aspirin 81 MG chewable tablet Chew 162 mg by mouth once.    Historical Provider, MD  benazepril (LOTENSIN) 40 MG tablet Take 40 mg by mouth daily.      Historical Provider, MD  cholecalciferol (VITAMIN D) 1000 UNITS tablet Take 1,000 Units by mouth daily.      Historical Provider, MD  fenofibrate 160 MG tablet Take 160 mg by mouth daily.    Historical Provider, MD  fluticasone (FLONASE) 50 MCG/ACT nasal spray Place 2 sprays into the nose 2 (two) times daily as needed. allergies     Historical Provider, MD  furosemide (LASIX) 20 MG tablet Take 20 mg by mouth daily. fluid    Historical Provider, MD  glimepiride (AMARYL) 4 MG tablet Take 4 mg by mouth daily before breakfast.      Historical Provider, MD  metFORMIN (GLUCOPHAGE-XR) 500 MG 24 hr tablet Take 500 mg by mouth daily with breakfast.    Historical Provider, MD  metoprolol (TOPROL-XL) 100 MG 24 hr tablet Take 150 mg by mouth daily. Pt takes 1.5  tab (  dose)    Historical Provider, MD  nitroGLYCERIN (NITROSTAT) 0.4 MG SL tablet Place 1 tablet (0.4 mg total) under the tongue once. 04/08/12   Rosalio Macadamia, NP  pantoprazole (PROTONIX) 40 MG tablet Take 1 tablet (40 mg total) by mouth daily. 03/31/12 03/31/13  Zannie Cove, MD  potassium chloride SA (K-DUR,KLOR-CON) 20 MEQ tablet Take 20 mEq by mouth 2 (two) times daily.      Historical Provider, MD  pravastatin (PRAVACHOL) 40 MG tablet Take 1 tablet (40 mg total) by mouth every evening. 04/08/12 04/08/13  Rosalio Macadamia, NP   BP 181/82 mmHg  Pulse 114  Temp(Src) 99 F (37.2 C) (Oral)  Resp 16  Ht  (1.676 m)  Wt 205 lb (92.987 kg)  BMI 33.10 kg/m2  SpO2  94% Physical Exam  Constitutional: She is oriented to person, place, and time. She appears well-developed and well-nourished. No distress.  HENT:  Head: Atraumatic.  Eyes: Conjunctivae are normal.  Neck: Neck supple.  Cardiovascular:  Tachycardia without murmur rubs or gallops  Pulmonary/Chest: Effort normal and breath sounds normal. No respiratory distress. She has no wheezes. She has no rales.  Abdominal: Soft. Bowel sounds are normal. There is tenderness (Diffuse abdominal tenderness without guarding or rebound tenderness. Protuberant abdomen.. No bruising noted.).  Musculoskeletal: She exhibits no edema.  Neurological: She is alert and oriented to person, place, and time.  Skin: No rash noted.  Psychiatric: She has a normal mood and affect.  Nursing note and vitals reviewed.   ED Course  Procedures (including critical care time)  10:36 PM Patient presents with abdominal pain with associative nausea. vomiting constipation and subjective fever. She does have an elevated WBC of 16.2 with a left shift. Plan to obtain an abdominopelvic CT scan for further evaluation. Pain medication was offered but pt declined.  Care discussed with Dr. Blinda Leatherwood.    1:14 AM Elevated BNP of 222.5, no prior value for comparison.  However pt denies SOB, CXR unremarkable, ECG without acute ischemic changes and no evidence of fluid overload on exam.  UA without infection.  Abd/pelvic CT without acute finding.  At this time pt agrees to f/u with her PCP for further evaluation.  Care discussed with Dr. Blinda Leatherwood.  Return precaution discussed.   Labs Review Labs Reviewed  CBC WITH DIFFERENTIAL/PLATELET - Abnormal; Notable for the following:    WBC 16.2 (*)    RBC 3.62 (*)    Hemoglobin 11.6 (*)    HCT 35.3 (*)    Neutrophils Relative % 83 (*)    Neutro Abs 13.6 (*)    Lymphocytes Relative 9 (*)    Monocytes Absolute 1.1 (*)    All other components within normal limits  COMPREHENSIVE METABOLIC PANEL -  Abnormal; Notable for the following:    Chloride 100 (*)    Glucose, Bld 201 (*)    Albumin 3.1 (*)    ALT 13 (*)    All other components within normal limits  BRAIN NATRIURETIC PEPTIDE - Abnormal; Notable for the following:    B Natriuretic Peptide 222.5 (*)    All other components within normal limits  URINALYSIS, ROUTINE W REFLEX MICROSCOPIC (NOT AT [facility])    Imaging Review Dg Chest 2 View  01/16/2015   CLINICAL DATA:  Shortness of breath.  EXAM: CHEST  2 VIEW  COMPARISON:  03/30/2012  FINDINGS: The cardiomediastinal contours are normal. The lungs are clear. Pulmonary vasculature is normal. No consolidation, pleural effusion, or  pneumothorax. Surgical hardware in the right proximal humerus. Compression deformities in the thoracolumbar junction with vertebroplasty. No acute osseous abnormalities are seen.  IMPRESSION: No acute pulmonary process.   Electronically Signed   By: Rubye OaksMelanie  Ehinger M.D.   On: 01/16/2015 22:56   Ct Abdomen Pelvis W Contrast  01/17/2015   CLINICAL DATA:  Abdominal pain.  Abdominal and lower leg edema.  EXAM: CT ABDOMEN AND PELVIS WITH CONTRAST  TECHNIQUE: Multidetector CT imaging of the abdomen and pelvis was performed using the standard protocol following bolus administration of intravenous contrast.  CONTRAST:  100 mL Omnipaque 300 IV  COMPARISON:  None.  FINDINGS: The included lung bases are clear.  Clips in the gallbladder fossa from cholecystectomy. No biliary dilatation. No focal hepatic lesion. Liver is borderline enlarged. The spleen and adrenal glands are normal. Pancreatic atrophy without ductal dilatation or inflammatory change.  Mild thinning of both renal parenchyma without hydronephrosis. There is a 3.1 cm cyst in the upper right kidney.  The stomach is physiologically distended. There are no dilated or thickened bowel loops. There is distal diverticulosis without diverticulitis. Small volume of colonic stool. The appendix is tentatively identified. No free  air, free fluid, or intra-abdominal fluid collection.  No retroperitoneal adenopathy. Abdominal aorta is normal in caliber. There is moderate atherosclerosis of the abdominal aorta and its branches.  Within the pelvis the bladder is physiologically distended. Uterus is surgically absent. 1.5 cm left ovarian cyst. Right ovary is tentatively identified, atrophic and normal for age. No pelvic free fluid. Fat within both inguinal canals. Small fat containing umbilical hernia.  Kyphoplasty within T12 and L1. Degenerative change throughout the spine. There are no acute or suspicious osseous abnormalities.  IMPRESSION: 1. No acute abnormality in the abdomen/pelvis. 2. Diverticulosis without diverticulitis. 3. Chronic findings include postcholecystectomy and hysterectomy. Fat within both inguinal canals and small fat containing umbilical hernia. Right renal cyst.   Electronically Signed   By: Rubye OaksMelanie  Ehinger M.D.   On: 01/17/2015 01:00     EKG Interpretation   Date/Time:  Sunday Jan 16 2015 21:44:36 EDT Ventricular Rate:  105 PR Interval:  214 QRS Duration: 60 QT Interval:  328 QTC Calculation: 433 R Axis:   18 Text Interpretation:  Sinus tachycardia with 1st degree A-V block  Otherwise normal ECG Confirmed by POLLINA  MD, CHRISTOPHER [phone]) on  01/16/2015 10:43:26 PM      MDM   Final diagnoses:  Generalized abdominal pain    BP 118/54 mmHg  Pulse 85  Temp(Src) 99 F (37.2 C) (Oral)  Resp 18  Ht 5\' 6"  (1.676 m)  Wt 205 lb (92.987 kg)  BMI 33.10 kg/m2  SpO2 95%  I have reviewed nursing notes and vital signs. I personally reviewed the imaging tests through PACS system  I reviewed available ER/hospitalization records thought the EMR     Fayrene HelperBowie Jimma Ortman, PA-C 01/17/15 0115  Gilda Creasehristopher J Pollina, MD 01/17/15 [phone]

## 2015-01-16 NOTE — ED Notes (Signed)
Pt. reports abdominal and bilateral lower legs swelling/edema onset this week with mild SOB, occasional dry cough  , emesis and fatigue . Denies fever or chills.

## 2015-01-16 NOTE — ED Provider Notes (Addendum)
Patient presented to the ER with chondral pain. Patient reports that she fell last week and landed on her abdomen. She reports that she has been swelling and has been experiencing abdominal pain ever since.  Face to face Exam: HEENT - PERRLA Lungs - CTAB Heart - RRR, no M/R/G Abd - distended, diffusely tender Neuro - alert, oriented x3  Plan: Basic labs, CT abdomen and pelvis to further evaluate for intra-abdominal injury and pathology.  Gilda Creasehristopher J Pollina, MD 01/16/15 2250  Gilda Creasehristopher J Pollina, MD 01/16/15 2251

## 2015-01-16 NOTE — ED Notes (Signed)
Pt. also stated that she fell last week and landed on her stomach , no LOC / ambulatory.

## 2015-01-17 LAB — URINALYSIS, ROUTINE W REFLEX MICROSCOPIC
BILIRUBIN URINE: NEGATIVE
Glucose, UA: NEGATIVE mg/dL
Hgb urine dipstick: NEGATIVE
Ketones, ur: NEGATIVE mg/dL
Leukocytes, UA: NEGATIVE
Nitrite: NEGATIVE
PROTEIN: NEGATIVE mg/dL
Specific Gravity, Urine: 1.007 (ref 1.005–1.030)
Urobilinogen, UA: 0.2 mg/dL (ref 0.0–1.0)
pH: 6 (ref 5.0–8.0)

## 2015-01-17 NOTE — Discharge Instructions (Signed)
Please follow up closely with your doctor for further evaluation of your abdominal pain.    Abdominal Pain, Women Abdominal (stomach, pelvic, or belly) pain can be caused by many things. It is important to tell your doctor:  The location of the pain.  Does it come and go or is it present all the time?  Are there things that start the pain (eating certain foods, exercise)?  Are there other symptoms associated with the pain (fever, nausea, vomiting, diarrhea)? All of this is helpful to know when trying to find the cause of the pain. CAUSES   Stomach: virus or bacteria infection, or ulcer.  Intestine: appendicitis (inflamed appendix), regional ileitis (Crohn's disease), ulcerative colitis (inflamed colon), irritable bowel syndrome, diverticulitis (inflamed diverticulum of the colon), or cancer of the stomach or intestine.  Gallbladder disease or stones in the gallbladder.  Kidney disease, kidney stones, or infection.  Pancreas infection or cancer.  Fibromyalgia (pain disorder).  Diseases of the female organs:  Uterus: fibroid (non-cancerous) tumors or infection.  Fallopian tubes: infection or tubal pregnancy.  Ovary: cysts or tumors.  Pelvic adhesions (scar tissue).  Endometriosis (uterus lining tissue growing in the pelvis and on the pelvic organs).  Pelvic congestion syndrome (female organs filling up with blood just before the menstrual period).  Pain with the menstrual period.  Pain with ovulation (producing an egg).  Pain with an IUD (intrauterine device, birth control) in the uterus.  Cancer of the female organs.  Functional pain (pain not caused by a disease, may improve without treatment).  Psychological pain.  Depression. DIAGNOSIS  Your doctor will decide the seriousness of your pain by doing an examination.  Blood tests.  X-rays.  Ultrasound.  CT scan (computed tomography, special type of X-ray).  MRI (magnetic resonance imaging).  Cultures,  for infection.  Barium enema (dye inserted in the large intestine, to better view it with X-rays).  Colonoscopy (looking in intestine with a lighted tube).  Laparoscopy (minor surgery, looking in abdomen with a lighted tube).  Major abdominal exploratory surgery (looking in abdomen with a large incision). TREATMENT  The treatment will depend on the cause of the pain.   Many cases can be observed and treated at home.  Over-the-counter medicines recommended by your caregiver.  Prescription medicine.  Antibiotics, for infection.  Birth control pills, for painful periods or for ovulation pain.  Hormone treatment, for endometriosis.  Nerve blocking injections.  Physical therapy.  Antidepressants.  Counseling with a psychologist or psychiatrist.  Minor or major surgery. HOME CARE INSTRUCTIONS   Do not take laxatives, unless directed by your caregiver.  Take over-the-counter pain medicine only if ordered by your caregiver. Do not take aspirin because it can cause an upset stomach or bleeding.  Try a clear liquid diet (broth or water) as ordered by your caregiver. Slowly move to a bland diet, as tolerated, if the pain is related to the stomach or intestine.  Have a thermometer and take your temperature several times a day, and record it.  Bed rest and sleep, if it helps the pain.  Avoid sexual intercourse, if it causes pain.  Avoid stressful situations.  Keep your follow-up appointments and tests, as your caregiver orders.  If the pain does not go away with medicine or surgery, you may try:  Acupuncture.  Relaxation exercises (yoga, meditation).  Group therapy.  Counseling. SEEK MEDICAL CARE IF:   You notice certain foods cause stomach pain.  Your home care treatment is not helping your pain.  You need stronger pain medicine.  You want your IUD removed.  You feel faint or lightheaded.  You develop nausea and vomiting.  You develop a rash.  You are  having side effects or an allergy to your medicine. SEEK IMMEDIATE MEDICAL CARE IF:   Your pain does not go away or gets worse.  You have a fever.  Your pain is felt only in portions of the abdomen. The right side could possibly be appendicitis. The left lower portion of the abdomen could be colitis or diverticulitis.  You are passing blood in your stools (bright red or black tarry stools, with or without vomiting).  You have blood in your urine.  You develop chills, with or without a fever.  You pass out. MAKE SURE YOU:   Understand these instructions.  Will watch your condition.  Will get help right away if you are not doing well or get worse. Document Released: 06/03/2007 Document Revised: 12/21/2013 Document Reviewed: 06/23/2009 [facility] Patient Information 2015 [place], [place]. This information is not intended to replace advice given to you by your health care provider. Make sure you discuss any questions you have with your health care provider.

## 2015-03-02 ENCOUNTER — Other Ambulatory Visit: Payer: Self-pay | Admitting: Gastroenterology

## 2015-03-03 ENCOUNTER — Other Ambulatory Visit: Payer: Self-pay | Admitting: Gastroenterology

## 2015-03-07 ENCOUNTER — Ambulatory Visit (HOSPITAL_COMMUNITY)
Admission: RE | Admit: 2015-03-07 | Discharge: 2015-03-07 | Disposition: A | Discharge status: Home / Self Care | Payer: Medicare HMO | Source: Ambulatory Visit | Attending: Gastroenterology | Admitting: Gastroenterology

## 2015-03-07 ENCOUNTER — Encounter (HOSPITAL_COMMUNITY): Payer: Self-pay | Admitting: *Deleted

## 2015-03-07 ENCOUNTER — Encounter (HOSPITAL_COMMUNITY)
Admission: RE | Disposition: A | Discharge status: Home / Self Care | Payer: Self-pay | Source: Ambulatory Visit | Attending: Gastroenterology

## 2015-03-07 DIAGNOSIS — K219 Gastro-esophageal reflux disease without esophagitis: Secondary | ICD-10-CM | POA: Diagnosis not present

## 2015-03-07 DIAGNOSIS — E119 Type 2 diabetes mellitus without complications: Secondary | ICD-10-CM | POA: Diagnosis not present

## 2015-03-07 DIAGNOSIS — M1990 Unspecified osteoarthritis, unspecified site: Secondary | ICD-10-CM | POA: Diagnosis not present

## 2015-03-07 DIAGNOSIS — I10 Essential (primary) hypertension: Secondary | ICD-10-CM | POA: Insufficient documentation

## 2015-03-07 DIAGNOSIS — Z9049 Acquired absence of other specified parts of digestive tract: Secondary | ICD-10-CM | POA: Diagnosis not present

## 2015-03-07 DIAGNOSIS — R1013 Epigastric pain: Secondary | ICD-10-CM | POA: Diagnosis not present

## 2015-03-07 HISTORY — PX: ESOPHAGOGASTRODUODENOSCOPY: SHX5428

## 2015-03-07 LAB — GLUCOSE, CAPILLARY: Glucose-Capillary: 120 mg/dL — ABNORMAL HIGH (ref 65–99)

## 2015-03-07 SURGERY — EGD (ESOPHAGOGASTRODUODENOSCOPY)
Anesthesia: Moderate Sedation

## 2015-03-07 MED ORDER — MIDAZOLAM HCL 10 MG/2ML IJ SOLN
INTRAMUSCULAR | Status: DC | PRN
  Administered 2015-03-07 (×2): 2 mg via INTRAVENOUS

## 2015-03-07 MED ORDER — BUTAMBEN-TETRACAINE-BENZOCAINE 2-2-14 % EX AERO
INHALATION_SPRAY | CUTANEOUS | Status: DC | PRN
  Administered 2015-03-07: 1 via TOPICAL

## 2015-03-07 MED ORDER — SODIUM CHLORIDE 0.9 % IV SOLN
INTRAVENOUS | Status: DC
  Administered 2015-03-07: 500 mL via INTRAVENOUS

## 2015-03-07 MED ORDER — DIPHENHYDRAMINE HCL 50 MG/ML IJ SOLN
INTRAMUSCULAR | Status: AC
  Filled 2015-03-07: qty 1

## 2015-03-07 MED ORDER — FENTANYL CITRATE (PF) 100 MCG/2ML IJ SOLN
INTRAMUSCULAR | Status: AC
  Filled 2015-03-07: qty 2

## 2015-03-07 MED ORDER — MIDAZOLAM HCL 5 MG/ML IJ SOLN
INTRAMUSCULAR | Status: AC
  Filled 2015-03-07: qty 2

## 2015-03-07 MED ORDER — SODIUM CHLORIDE 0.9 % IV SOLN
INTRAVENOUS | Status: DC
Start: 2015-03-07 — End: 2015-03-07

## 2015-03-07 MED ORDER — FENTANYL CITRATE (PF) 100 MCG/2ML IJ SOLN
INTRAMUSCULAR | Status: DC | PRN
  Administered 2015-03-07 (×2): 25 ug via INTRAVENOUS

## 2015-03-07 NOTE — H&P (Signed)
  Problem: Epigastric abdominal discomfort on metformin and aspirin. Normal colonoscopy performed on 03/28/2009. Normal colonoscopy and esophagogastroduodenoscopy performed on 02/24/2008. Remote laparoscopic cholecystectomy.  History: The patient is a 74 year old female born [birth year]. When she developed epigastric discomfort I recommended stopping metformin and aspirin. Off these medications, her epigastric discomfort diminished but did not completely resolved. She does take Protonix each morning.  She is scheduled to undergo diagnostic esophagogastroduodenoscopy to rule out aspirin induced ulcer disease.  Past medical history: Laparoscopic cholecystectomy. Hysterectomy. Hypertension. Height cholesterolemia. Osteoarthritis. Gastroesophageal reflux. Colonic diverticulosis. Type 2 diabetes mellitus  Medication allergies: None  Exam: The patient is alert and lying comfortably on the endoscopy stretcher. Abdomen is soft and nontender to palpation. Cardiac exam reveals a regular rhythm. Lungs are clear to auscultation.  Plan: Proceed with diagnostic esophagogastroduodenoscopy to evaluate epigastric abdominal discomfort which developed while taking metformin and aspirin

## 2015-03-07 NOTE — Discharge Instructions (Signed)
Esophagogastroduodenoscopy °Care After °Refer to this sheet in the next few weeks. These instructions provide you with information on caring for yourself after your procedure. Your caregiver may also give you more specific instructions. Your treatment has been planned according to current medical practices, but problems sometimes occur. Call your caregiver if you have any problems or questions after your procedure.  °HOME CARE INSTRUCTIONS °· Do not eat or drink anything until the numbing medicine (local anesthetic) has worn off and your gag reflex has returned. You will know that the local anesthetic has worn off when you can swallow comfortably. °· Do not drive for 12 hours after the procedure or as directed by your caregiver. °· Only take medicines as directed by your caregiver. °SEEK MEDICAL CARE IF:  °· You cannot stop coughing. °· You are not urinating at all or less than usual. °SEEK IMMEDIATE MEDICAL CARE IF: °· You have difficulty swallowing. °· You cannot eat or drink. °· You have worsening throat or chest pain. °· You have dizziness, lightheadedness, or you faint. °· You have nausea or vomiting. °· You have chills. °· You have a fever. °· You have severe abdominal pain. °· You have black, tarry, or bloody stools. °Document Released: 07/23/2012 Document Reviewed: 07/23/2012 °[employer]® Patient Information ©2015 [employer]. This information is not intended to replace advice given to you by your health care provider. Make sure you discuss any questions you have with your health care provider. ° °

## 2015-03-07 NOTE — Op Note (Signed)
Problem: Epigastric abdominal discomfort which developed on metformin and aspirin. Normal colonoscopy performed on 03/28/2009. Normal esophagogastroduodenoscopy and colonoscopy performed on 7/70 last 2009  Endoscopist: Danise EdgeMartin Majed Pellegrin  Premedication: Versed 4 mg. Fentanyl 50 g.  Procedure: Diagnostic esophagogastroduodenoscopy The patient was placed in the left lateral decubitus position. The Pentax gastroscope was passed through the posterior hypopharynx into the proximal esophagus without difficulty. The hypopharynx, larynx, and vocal cords appeared normal.  Esophagoscopy: The proximal, mid, and lower segments of the esophageal mucosa appeared normal. The squamocolumnar junction appeared regular and was noted at 42 cm from the incisor teeth.  Gastroscopy: Retroflex view of the gastric cardia and fundus was normal. The gastric body, antrum, and pylorus appeared normal.  Duodenoscopy: The duodenal bulb and descending duodenum appeared normal.  Assessment: Normal esophagogastroduodenoscopy.  Recommendation: Remain off metformin and aspirin. Continue taking Protonix 40 mg perform this each morning.

## 2015-03-10 ENCOUNTER — Encounter (HOSPITAL_COMMUNITY): Payer: Self-pay | Admitting: Gastroenterology

## 2015-12-16 ENCOUNTER — Ambulatory Visit (INDEPENDENT_AMBULATORY_CARE_PROVIDER_SITE_OTHER): Payer: Medicare HMO | Admitting: Internal Medicine

## 2015-12-16 ENCOUNTER — Encounter: Payer: Self-pay | Admitting: Internal Medicine

## 2015-12-16 VITALS — BP 124/66 | HR 97 | Ht 66.0 in | Wt 198.8 lb

## 2015-12-16 DIAGNOSIS — E785 Hyperlipidemia, unspecified: Secondary | ICD-10-CM

## 2015-12-16 DIAGNOSIS — I10 Essential (primary) hypertension: Secondary | ICD-10-CM

## 2015-12-16 LAB — LIPID PANEL
Cholesterol: 171 mg/dL (ref 125–200)
HDL: 36 mg/dL — AB (ref >=46)
LDL Cholesterol: 94 mg/dL (ref <130)
Total CHOL/HDL Ratio: 4.8 Ratio (ref <=5.0)
Triglycerides: 204 mg/dL — ABNORMAL HIGH (ref <150)
VLDL: 41 mg/dL — ABNORMAL HIGH (ref <30)

## 2015-12-16 LAB — BASIC METABOLIC PANEL
BUN: 21 mg/dL (ref 7–25)
CALCIUM: 9.5 mg/dL (ref 8.6–10.4)
CHLORIDE: 100 mmol/L (ref 98–110)
CO2: 23 mmol/L (ref 20–31)
Creat: 1.04 mg/dL — ABNORMAL HIGH (ref 0.60–0.93)
Glucose, Bld: 176 mg/dL — ABNORMAL HIGH (ref 65–99)
Potassium: 4.4 mmol/L (ref 3.5–5.3)
Sodium: 138 mmol/L (ref 135–146)

## 2015-12-16 LAB — CBC
HEMATOCRIT: 37.8 % (ref 35.0–45.0)
Hemoglobin: 12.3 g/dL (ref 11.7–15.5)
MCH: 30.8 pg (ref 27.0–33.0)
MCHC: 32.5 g/dL (ref 32.0–36.0)
MCV: 94.7 fL (ref 80.0–100.0)
MPV: 12.8 fL — AB (ref 7.5–12.5)
Platelets: 315 10*3/uL (ref 140–400)
RBC: 3.99 MIL/uL (ref 3.80–5.10)
RDW: 14.5 % (ref 11.0–15.0)
WBC: 8.6 10*3/uL (ref 3.8–10.8)

## 2015-12-16 NOTE — Progress Notes (Signed)
Cardiology Office Note   Date:  12/16/2015   ID:  [name], DOB January 27, 1941, MRN [id number]  PCP:  Eartha InchBADGER,MICHAEL C, MD  Cardiologist:   Dietrich PatesPaula Sawyer Mentzer, MD   F/U of CP    History of Present Illness: [name] is a 75 y.o. female with a history of CP  She was last seen in cardiology in 2013  Felt atypical  Myoview was OK  Started on PPI Also has a history of HTn, GERD, HL, DM and OA  Had myalgias on lipiitor  Switched to pravachol    Pt had URI that lasted 6 wks  Felt bad  Seen in IM clinic by Fannie Knee Anderson.    Breathing is back to normal  No wheezing now No cough  No CP now   Chest wall is slightly tender  Did have when sick    Outpatient Prescriptions Prior to Visit  Medication Sig Dispense Refill  . allopurinol (ZYLOPRIM) 300 MG tablet Take 300 mg by mouth daily.    Marland Kitchen. amLODipine (NORVASC) 5 MG tablet TAKE 1 TABLET (5 MG TOTAL) BY MOUTH DAILY. 30 tablet 0  . cholecalciferol (VITAMIN D) 1000 UNITS tablet Take 1,000 Units by mouth daily.      . clonazePAM (KLONOPIN) 0.5 MG tablet Take 0.5-1 tablets by mouth 3 (three) times daily as needed. ITCHING    . fenofibrate 160 MG tablet Take 160 mg by mouth daily.    . furosemide (LASIX) 20 MG tablet Take 30 mg by mouth daily. For fluid    . glimepiride (AMARYL) 4 MG tablet Take 8 mg by mouth daily.    . Insulin Glargine (LANTUS) 100 UNIT/ML Solostar Pen Inject 35-50 Units into the skin as directed.    Marland Kitchen. levothyroxine (SYNTHROID, LEVOTHROID) 75 MCG tablet Take 75 mcg by mouth daily before breakfast.    . metoprolol succinate (TOPROL-XL) 100 MG 24 hr tablet Take 150 mg by mouth daily. Take 1 and 1/2 tablets by mouth once daily    . nitroGLYCERIN (NITROSTAT) 0.4 MG SL tablet Place 1 tablet (0.4 mg total) under the tongue once. 25 tablet 6  . nitroGLYCERIN (NITROSTAT) 0.4 MG SL tablet Place 0.4 mg under the tongue as directed. Place one tablet under the tongue every 5 minutes as needed for chest pain    . ondansetron (ZOFRAN) 4 MG  tablet Take 4 mg by mouth 3 (three) times daily as needed. NAUSEA OR VOMITING    . pantoprazole (PROTONIX) 40 MG tablet Take 40 mg by mouth daily.    . pravastatin (PRAVACHOL) 80 MG tablet Take 80 mg by mouth daily.    . fluticasone (FLONASE) 50 MCG/ACT nasal spray Place 2 sprays into the nose 2 (two) times daily as needed. Reported on 12/16/2015    . fluticasone (FLONASE) 50 MCG/ACT nasal spray Place 1-2 sprays into both nostrils 2 (two) times daily as needed. Reported on 12/16/2015    . losartan (COZAAR) 100 MG tablet Take 100 mg by mouth daily. Reported on 12/16/2015    . potassium chloride SA (K-DUR,KLOR-CON) 20 MEQ tablet Take 20 mEq by mouth daily. Reported on 12/16/2015     No facility-administered medications prior to visit.     Allergies:   Liraglutide; Niacin; Atorvastatin; Darvocet; Metformin and related; and Propoxyphene   Past Medical History  Diagnosis Date  . Hypertension   . GERD (gastroesophageal reflux disease)   . Diabetes mellitus   . Hyperlipidemia   . Arthritis   . Chest pain  Hospital 03/2012  // outpatient nuclear normal 04/02/2012  EF  80%  . Obesity   . Stress at home     Past Surgical History  Procedure Laterality Date  . Shoulder surgery    . Back surgery    . Abdominal hysterectomy    . Esophagogastroduodenoscopy N/A 03/07/2015    Procedure: ESOPHAGOGASTRODUODENOSCOPY (EGD);  Surgeon: Charolett Bumpers, MD;  Location: [facility];  Service: Endoscopy;  Laterality: N/A;  NO PROPOFOL     Social History:  The patient  reports that she quit smoking about 43 years ago. Her smoking use included Cigarettes. She smoked 0.30 packs per day for 0 years. She does not have any smokeless tobacco history on file. She reports that she does not drink alcohol or use illicit drugs.   Family History:  The patient's family history includes Heart attack in her father; Heart failure in her mother.    ROS:  Please see the history of present illness. All other systems are  reviewed and  Negative to the above problem except as noted.    PHYSICAL EXAM: VS:  BP 124/66 mmHg  Pulse 97  Ht  (1.676 m)  Wt 198 lb 12.8 oz (90.175 kg)  BMI 32.10 kg/m2  SpO2 98%  ZOX:WRUEA 75 yo in no acute distress HEENT: normal Neck: no JVD, carotid bruits, or masses Cardiac: RRR; no murmurs, rubs, or gallops,no edema  Respiratory:  clear to auscultation bilaterally, normal work of breathing GI: soft, nontender, nondistended, + BS  No hepatomegaly  MS: no deformity Moving all extremities   Skin: warm and dry, no rash Neuro:  Strength and sensation are intact Psych: euthymic mood, full affect   EKG:  EKG is not  ordered today.  SR 86   (11/22/15)   Lipid Panel    Component Value Date/Time   CHOL 258* 05/26/2012 0925   TRIG 303.0* 05/26/2012 0925   HDL 37.00* 05/26/2012 0925   CHOLHDL 7 05/26/2012 0925   VLDL 60.6* 05/26/2012 0925   LDLCALC 119* 03/31/2012 0641   LDLDIRECT 168.3 05/26/2012 0925      Wt Readings from Last 3 Encounters:  12/16/15 198 lb 12.8 oz (90.175 kg)  03/07/15 205 lb (92.987 kg)  01/16/15 205 lb (92.987 kg)      ASSESSMENT AND PLAN  1  CP  Does not sound cardiac  Probable musculoskeletal   2.  HL  Check lipids on statin    3  HTN  BP is good  Check BMET and CBC  I encouraged her to increase her activity    F/U in 1 year     Signed, Dietrich Pates, MD  12/16/2015 10:36 AM    [facility] [address] [place], [place], [place]  [address] Phone: [phone]; Fax: [phone]

## 2015-12-16 NOTE — Patient Instructions (Signed)
Your physician recommends that you continue on your current medications as directed. Please refer to the Current Medication list given to you today. Your physician recommends that you return for lab work in: today (CBC, BMET, LIPIDS) Your physician wants you to follow-up in: 1 YEAR WITH DR. ROSS.  You will receive a reminder letter in the mail two months in advance. If you don't receive a letter, please call our office to schedule the follow-up appointment.  

## 2015-12-16 NOTE — Addendum Note (Signed)
Addended by: Pricilla RiffleOSS, Shauni Henner V on: 12/16/2015 11:54 AM   Modules accepted: Level of Service

## 2015-12-27 ENCOUNTER — Encounter: Payer: Self-pay | Admitting: Internal Medicine

## 2016-04-02 ENCOUNTER — Encounter: Payer: Self-pay | Admitting: Pediatrics

## 2016-04-02 ENCOUNTER — Ambulatory Visit (INDEPENDENT_AMBULATORY_CARE_PROVIDER_SITE_OTHER): Payer: Medicare HMO | Admitting: Pediatrics

## 2016-04-02 VITALS — BP 137/69 | HR 84 | Temp 97.2°F | Ht 66.0 in | Wt 201.6 lb

## 2016-04-02 DIAGNOSIS — T148 Other injury of unspecified body region: Secondary | ICD-10-CM | POA: Diagnosis not present

## 2016-04-02 DIAGNOSIS — E1165 Type 2 diabetes mellitus with hyperglycemia: Secondary | ICD-10-CM

## 2016-04-02 DIAGNOSIS — W57XXXA Bitten or stung by nonvenomous insect and other nonvenomous arthropods, initial encounter: Secondary | ICD-10-CM

## 2016-04-02 DIAGNOSIS — E785 Hyperlipidemia, unspecified: Secondary | ICD-10-CM | POA: Diagnosis not present

## 2016-04-02 DIAGNOSIS — I10 Essential (primary) hypertension: Secondary | ICD-10-CM | POA: Diagnosis not present

## 2016-04-02 DIAGNOSIS — B372 Candidiasis of skin and nail: Secondary | ICD-10-CM | POA: Diagnosis not present

## 2016-04-02 DIAGNOSIS — L853 Xerosis cutis: Secondary | ICD-10-CM | POA: Diagnosis not present

## 2016-04-02 DIAGNOSIS — Z6832 Body mass index (BMI) 32.0-32.9, adult: Secondary | ICD-10-CM | POA: Diagnosis not present

## 2016-04-02 DIAGNOSIS — Z794 Long term (current) use of insulin: Secondary | ICD-10-CM | POA: Diagnosis not present

## 2016-04-02 DIAGNOSIS — E118 Type 2 diabetes mellitus with unspecified complications: Secondary | ICD-10-CM | POA: Diagnosis not present

## 2016-04-02 DIAGNOSIS — IMO0002 Reserved for concepts with insufficient information to code with codable children: Secondary | ICD-10-CM

## 2016-04-02 MED ORDER — NYSTATIN 100000 UNIT/GM EX OINT: 1.0000 "application " | TOPICAL_OINTMENT | Freq: Two times a day (BID) | CUTANEOUS | 2 refills | Status: DC

## 2016-04-02 MED ORDER — FLUCONAZOLE 150 MG PO TABS: 150.0000 mg | ORAL_TABLET | Freq: Once | ORAL | 0 refills | Status: AC

## 2016-04-02 MED ORDER — HYDROCORTISONE 1 % EX LOTN: 1.0000 "application " | TOPICAL_LOTION | Freq: Two times a day (BID) | CUTANEOUS | 0 refills | Status: AC

## 2016-04-02 NOTE — Patient Instructions (Addendum)
Use thick moisturizing cream such as cerave or eucerin twice a day on legs and rest of body  Bug bites: use hydrocortisone cream  Rash under breasts and genital area: use nystatin ointment  Avoid hairspray and hair products for a week to see if helps with itching. I don't see any lice today so I don't think the lice treatment will help. Make sure you are drinking water daily, to prevent getting dehydrated because this will make your skin dry as well.

## 2016-04-02 NOTE — Progress Notes (Signed)
    Subjective:    Patient ID: [name], female    DOB: 08-04-1941, 75 y.o.   MRN: [id number]  CC: New Patient (Initial Visit)   HPI: [name] is a 75 y.o. female presenting for skin itching.  Has been seen by dermatology Gave her a cream, she says it didn't help She treated herself with OTC lice treatment, a beautician did it because pt with trouble reaching over her head Says the beautician didn't seen any lice at that time either Pt says her head itches regularly, also under her breasts and genital area No chest pain, no swelling Feeling well otherwise   Past Medical History:  Diagnosis Date  . Arthritis   . Chest pain    Hospital 03/2012  // outpatient nuclear normal 04/02/2012  EF  80%  . Diabetes mellitus   . GERD (gastroesophageal reflux disease)   . Hyperlipidemia   . Hypertension   . Obesity   . Stress at home    Family History  Problem Relation Age of Onset  . Heart failure Mother   . Heart attack Father    Social History  Substance Use Topics  . Smoking status: Former Smoker    Packs/day: 0.30    Years: 0.00    Types: Cigarettes    Quit date: 03/30/1972  . Smokeless tobacco: Never Used     Comment: Quit 40 years ago  . Alcohol use No     ROS: All systems negative other than what is in HPI     Objective:    BP 137/69   Pulse 84   Temp 97.2 F (36.2 C) (Oral)   Ht 5\' 6"  (1.676 m)   Wt 201 lb 9.6 oz (91.4 kg)   BMI 32.54 kg/m   Wt Readings from Last 3 Encounters:  04/02/16 201 lb 9.6 oz (91.4 kg)  12/16/15 198 lb 12.8 oz (90.2 kg)  03/07/15 205 lb (93 kg)     Gen: NAD, alert, cooperative with exam, NCAT EYES: EOMI, no conjunctival injection, or no icterus ENT:  OP without erythema LYMPH: no cervical LAD CV: NRRR, normal S1/S2, no murmur, distal pulses 2+ b/l Resp: CTABL, no wheezes, normal WOB Abd: +BS, soft, NTND.  Ext: No edema, warm Neuro: Alert and oriented, strength equal b/l UE and LE, coordination grossly  normal Skin: several areas of excoriation around ankles, lower ext. Xerosis present b/l legs. No nits, lice seen in hair, heavily hairprayed, no rash, clear scalp skin visible throughout GU: red skin over labia    Assessment & Plan:  [name] was seen today for new patient (initial visit) and skin itching.  Diagnoses and all orders for this visit:  BMI 32.0-32.9,adult Cont lifestyle changes  Uncontrolled type 2 diabetes mellitus with complication, with long-term current use of insulin (HCC) A1c most recently 7.9 last week Cont current meds  Essential hypertension Adequate control, cont current meds  Hyperlipidemia Cont pravastatin  Skin candidiasis Intertrigo areas -     fluconazole (DIFLUCAN) 150 MG tablet; Take 1 tablet (150 mg total) by mouth once. -     nystatin ointment (MYCOSTATIN); Apply 1 application topically 2 (two) times daily.  Bug bites -     hydrocortisone 1 % lotion; Apply 1 application topically 2 (two) times daily.  Xerosis Thick mositurizer twice a day  Follow up plan: Return in about 4 weeks (around 04/30/2016) for CPE.  Rex Krasarol Tacie Mccuistion, MD [facility] 04/02/2016, 10:42 AM

## 2016-04-30 ENCOUNTER — Telehealth: Payer: Self-pay | Admitting: Pediatrics

## 2016-04-30 NOTE — Telephone Encounter (Signed)
lmtcb to make an appointment

## 2016-04-30 NOTE — Telephone Encounter (Signed)
Needs to be seen. If still itching and she isnt seeing any bugs there are labs that we should probably get to figure out why she is still itching.

## 2016-04-30 NOTE — Telephone Encounter (Signed)
Please advise and route to Pool B 

## 2016-05-02 ENCOUNTER — Ambulatory Visit: Payer: Medicare HMO | Admitting: Pediatrics

## 2016-05-25 ENCOUNTER — Ambulatory Visit: Payer: Medicare HMO | Admitting: Pediatrics

## 2016-08-24 LAB — HEMOGLOBIN A1C: HEMOGLOBIN A1C: 7.9

## 2016-12-28 ENCOUNTER — Encounter: Payer: Self-pay | Admitting: Pharmacist

## 2017-03-06 ENCOUNTER — Encounter: Payer: Self-pay | Admitting: Neurology

## 2017-03-07 ENCOUNTER — Ambulatory Visit: Payer: Medicare HMO | Admitting: Neurology

## 2017-04-11 ENCOUNTER — Ambulatory Visit: Payer: Medicare HMO | Admitting: Neurology

## 2017-10-27 ENCOUNTER — Inpatient Hospital Stay (HOSPITAL_COMMUNITY)
Admission: EM | Admit: 2017-10-27 | Discharge: 2017-10-31 | DRG: 494 | Disposition: A | Discharge status: Skilled Nursing Facility | Payer: Medicare HMO | Attending: Orthopedic Surgery | Admitting: Orthopedic Surgery

## 2017-10-27 ENCOUNTER — Emergency Department (HOSPITAL_COMMUNITY): Payer: Medicare HMO | Admitting: Anesthesiology

## 2017-10-27 ENCOUNTER — Emergency Department (HOSPITAL_COMMUNITY): Payer: Medicare HMO

## 2017-10-27 ENCOUNTER — Encounter (HOSPITAL_COMMUNITY)
Admission: EM | Disposition: A | Discharge status: Skilled Nursing Facility | Payer: Self-pay | Source: Home / Self Care | Attending: Orthopedic Surgery

## 2017-10-27 ENCOUNTER — Other Ambulatory Visit: Payer: Self-pay

## 2017-10-27 ENCOUNTER — Encounter (HOSPITAL_COMMUNITY): Payer: Self-pay | Admitting: Emergency Medicine

## 2017-10-27 DIAGNOSIS — I10 Essential (primary) hypertension: Secondary | ICD-10-CM | POA: Diagnosis present

## 2017-10-27 DIAGNOSIS — E785 Hyperlipidemia, unspecified: Secondary | ICD-10-CM | POA: Diagnosis present

## 2017-10-27 DIAGNOSIS — Z79890 Hormone replacement therapy (postmenopausal): Secondary | ICD-10-CM

## 2017-10-27 DIAGNOSIS — Z87891 Personal history of nicotine dependence: Secondary | ICD-10-CM

## 2017-10-27 DIAGNOSIS — E669 Obesity, unspecified: Secondary | ICD-10-CM | POA: Diagnosis present

## 2017-10-27 DIAGNOSIS — Z885 Allergy status to narcotic agent status: Secondary | ICD-10-CM

## 2017-10-27 DIAGNOSIS — Z794 Long term (current) use of insulin: Secondary | ICD-10-CM

## 2017-10-27 DIAGNOSIS — R52 Pain, unspecified: Secondary | ICD-10-CM

## 2017-10-27 DIAGNOSIS — Z8249 Family history of ischemic heart disease and other diseases of the circulatory system: Secondary | ICD-10-CM

## 2017-10-27 DIAGNOSIS — E118 Type 2 diabetes mellitus with unspecified complications: Secondary | ICD-10-CM

## 2017-10-27 DIAGNOSIS — S82851A Displaced trimalleolar fracture of right lower leg, initial encounter for closed fracture: Principal | ICD-10-CM | POA: Diagnosis present

## 2017-10-27 DIAGNOSIS — W1830XA Fall on same level, unspecified, initial encounter: Secondary | ICD-10-CM | POA: Diagnosis present

## 2017-10-27 DIAGNOSIS — Z6832 Body mass index (BMI) 32.0-32.9, adult: Secondary | ICD-10-CM

## 2017-10-27 DIAGNOSIS — K219 Gastro-esophageal reflux disease without esophagitis: Secondary | ICD-10-CM | POA: Diagnosis present

## 2017-10-27 DIAGNOSIS — Z888 Allergy status to other drugs, medicaments and biological substances status: Secondary | ICD-10-CM

## 2017-10-27 DIAGNOSIS — E1165 Type 2 diabetes mellitus with hyperglycemia: Secondary | ICD-10-CM | POA: Diagnosis present

## 2017-10-27 DIAGNOSIS — E119 Type 2 diabetes mellitus without complications: Secondary | ICD-10-CM | POA: Diagnosis present

## 2017-10-27 DIAGNOSIS — M1990 Unspecified osteoarthritis, unspecified site: Secondary | ICD-10-CM | POA: Diagnosis present

## 2017-10-27 DIAGNOSIS — IMO0002 Reserved for concepts with insufficient information to code with codable children: Secondary | ICD-10-CM | POA: Diagnosis present

## 2017-10-27 DIAGNOSIS — Z7982 Long term (current) use of aspirin: Secondary | ICD-10-CM

## 2017-10-27 DIAGNOSIS — Z90710 Acquired absence of both cervix and uterus: Secondary | ICD-10-CM

## 2017-10-27 DIAGNOSIS — S9304XD Dislocation of right ankle joint, subsequent encounter: Secondary | ICD-10-CM

## 2017-10-27 DIAGNOSIS — W19XXXA Unspecified fall, initial encounter: Secondary | ICD-10-CM

## 2017-10-27 HISTORY — PX: ORIF ANKLE FRACTURE: SHX5408

## 2017-10-27 LAB — BASIC METABOLIC PANEL
Anion gap: 13 (ref 5–15)
BUN: 32 mg/dL — AB (ref 6–20)
CALCIUM: 8.8 mg/dL — AB (ref 8.9–10.3)
CO2: 22 mmol/L (ref 22–32)
Chloride: 102 mmol/L (ref 101–111)
Creatinine, Ser: 0.97 mg/dL (ref 0.44–1.00)
GFR calc Af Amer: >60 mL/min (ref >60)
GFR, EST NON AFRICAN AMERICAN: 55 mL/min — AB (ref >60)
GLUCOSE: 208 mg/dL — AB (ref 65–99)
Potassium: 3.8 mmol/L (ref 3.5–5.1)
Sodium: 137 mmol/L (ref 135–145)

## 2017-10-27 LAB — CBC
HCT: 42.4 % (ref 36.0–46.0)
Hemoglobin: 13.3 g/dL (ref 12.0–15.0)
MCH: 30.2 pg (ref 26.0–34.0)
MCHC: 31.4 g/dL (ref 30.0–36.0)
MCV: 96.1 fL (ref 78.0–100.0)
Platelets: 286 10*3/uL (ref 150–400)
RBC: 4.41 MIL/uL (ref 3.87–5.11)
RDW: 14.5 % (ref 11.5–15.5)
WBC: 16.6 10*3/uL — ABNORMAL HIGH (ref 4.0–10.5)

## 2017-10-27 SURGERY — OPEN REDUCTION INTERNAL FIXATION (ORIF) ANKLE FRACTURE
Anesthesia: General | Site: Leg Lower | Laterality: Right

## 2017-10-27 MED ORDER — ONDANSETRON HCL 4 MG/2ML IJ SOLN
INTRAMUSCULAR | Status: DC | PRN
  Administered 2017-10-27: 4 mg via INTRAVENOUS

## 2017-10-27 MED ORDER — FENTANYL CITRATE (PF) 100 MCG/2ML IJ SOLN
INTRAMUSCULAR | Status: DC | PRN
  Administered 2017-10-27 (×3): 50 ug via INTRAVENOUS

## 2017-10-27 MED ORDER — SUCCINYLCHOLINE CHLORIDE 20 MG/ML IJ SOLN
INTRAMUSCULAR | Status: DC | PRN
  Administered 2017-10-27: 100 mg via INTRAVENOUS

## 2017-10-27 MED ORDER — BUPIVACAINE HCL (PF) 0.25 % IJ SOLN
INTRAMUSCULAR | Status: DC | PRN
  Administered 2017-10-27: 20 mL

## 2017-10-27 MED ORDER — BUPIVACAINE HCL (PF) 0.25 % IJ SOLN
INTRAMUSCULAR | Status: AC
  Filled 2017-10-27: qty 30

## 2017-10-27 MED ORDER — LACTATED RINGERS IV SOLN
INTRAVENOUS | Status: DC | PRN
  Administered 2017-10-27 (×2): via INTRAVENOUS

## 2017-10-27 MED ORDER — 0.9 % SODIUM CHLORIDE (POUR BTL) OPTIME
TOPICAL | Status: DC | PRN
  Administered 2017-10-27: 1000 mL

## 2017-10-27 MED ORDER — ONDANSETRON HCL 4 MG/2ML IJ SOLN
4.0000 mg | Freq: Once | INTRAMUSCULAR | Status: AC
  Administered 2017-10-27: 4 mg via INTRAVENOUS
  Filled 2017-10-27: qty 2

## 2017-10-27 MED ORDER — PHENYLEPHRINE 40 MCG/ML (10ML) SYRINGE FOR IV PUSH (FOR BLOOD PRESSURE SUPPORT)
PREFILLED_SYRINGE | INTRAVENOUS | Status: AC
  Filled 2017-10-27: qty 10

## 2017-10-27 MED ORDER — PROPOFOL 10 MG/ML IV BOLUS
INTRAVENOUS | Status: AC
  Filled 2017-10-27: qty 20

## 2017-10-27 MED ORDER — CEFAZOLIN SODIUM-DEXTROSE 2-3 GM-%(50ML) IV SOLR
INTRAVENOUS | Status: DC | PRN
  Administered 2017-10-27: 2 g via INTRAVENOUS

## 2017-10-27 MED ORDER — FENTANYL CITRATE (PF) 250 MCG/5ML IJ SOLN
INTRAMUSCULAR | Status: AC
  Filled 2017-10-27: qty 5

## 2017-10-27 MED ORDER — PHENYLEPHRINE HCL 10 MG/ML IJ SOLN
INTRAMUSCULAR | Status: DC | PRN
  Administered 2017-10-27 (×2): 40 ug via INTRAVENOUS
  Administered 2017-10-27: 80 ug via INTRAVENOUS

## 2017-10-27 MED ORDER — GLYCOPYRROLATE 0.2 MG/ML IJ SOLN
INTRAMUSCULAR | Status: DC | PRN
  Administered 2017-10-27: 0.2 mg via INTRAVENOUS

## 2017-10-27 MED ORDER — FENTANYL CITRATE (PF) 100 MCG/2ML IJ SOLN
100.0000 ug | Freq: Once | INTRAMUSCULAR | Status: AC
  Administered 2017-10-27: 100 ug via INTRAVENOUS
  Filled 2017-10-27: qty 2

## 2017-10-27 MED ORDER — PROMETHAZINE HCL 25 MG/ML IJ SOLN
6.2500 mg | Freq: Once | INTRAMUSCULAR | Status: AC
  Administered 2017-10-27: 6.25 mg via INTRAVENOUS
  Filled 2017-10-27: qty 1

## 2017-10-27 MED ORDER — EPHEDRINE SULFATE 50 MG/ML IJ SOLN
INTRAMUSCULAR | Status: DC | PRN
  Administered 2017-10-27: 10 mg via INTRAVENOUS

## 2017-10-27 MED ORDER — SUCCINYLCHOLINE CHLORIDE 200 MG/10ML IV SOSY
PREFILLED_SYRINGE | INTRAVENOUS | Status: AC
  Filled 2017-10-27: qty 10

## 2017-10-27 MED ORDER — LIDOCAINE HCL (CARDIAC) 20 MG/ML IV SOLN
INTRAVENOUS | Status: AC
  Filled 2017-10-27: qty 5

## 2017-10-27 MED ORDER — PROPOFOL 10 MG/ML IV BOLUS
0.5000 mg/kg | Freq: Once | INTRAVENOUS | Status: AC
  Administered 2017-10-27: 40.8 mg via INTRAVENOUS
  Filled 2017-10-27: qty 20

## 2017-10-27 MED ORDER — LIDOCAINE HCL (CARDIAC) 20 MG/ML IV SOLN
INTRAVENOUS | Status: DC | PRN
  Administered 2017-10-27: 60 mg via INTRAVENOUS

## 2017-10-27 MED ORDER — ONDANSETRON HCL 4 MG/2ML IJ SOLN
INTRAMUSCULAR | Status: AC
  Filled 2017-10-27: qty 2

## 2017-10-27 MED ORDER — PROPOFOL 10 MG/ML IV BOLUS
INTRAVENOUS | Status: DC | PRN
  Administered 2017-10-27: 110 mg via INTRAVENOUS

## 2017-10-27 SURGICAL SUPPLY — 76 items
BANDAGE ACE 4X5 VEL STRL LF (GAUZE/BANDAGES/DRESSINGS) ×2 IMPLANT
BANDAGE ACE 6X5 VEL STRL LF (GAUZE/BANDAGES/DRESSINGS) IMPLANT
BANDAGE ELASTIC 6 VELCRO ST LF (GAUZE/BANDAGES/DRESSINGS) ×2 IMPLANT
BANDAGE ESMARK 6X9 LF (GAUZE/BANDAGES/DRESSINGS) ×1 IMPLANT
BIT DRILL 2.5X2.75 QC CALB (BIT) ×2 IMPLANT
BIT DRILL 2.9 CANN QC NONSTRL (BIT) ×2 IMPLANT
BIT DRILL 3.5X5.5 QC CALB (BIT) ×2 IMPLANT
BLADE CLIPPER SURG (BLADE) IMPLANT
BNDG CMPR 9X6 STRL LF SNTH (GAUZE/BANDAGES/DRESSINGS) ×1
BNDG ESMARK 6X9 LF (GAUZE/BANDAGES/DRESSINGS) ×3
COVER MAYO STAND STRL (DRAPES) ×3 IMPLANT
COVER SURGICAL LIGHT HANDLE (MISCELLANEOUS) ×3 IMPLANT
CUFF TOURNIQUET SINGLE 34IN LL (TOURNIQUET CUFF) IMPLANT
CUFF TOURNIQUET SINGLE 44IN (TOURNIQUET CUFF) IMPLANT
DRAPE OEC MINIVIEW 54X84 (DRAPES) ×2 IMPLANT
DRAPE U-SHAPE 47X51 STRL (DRAPES) ×3 IMPLANT
DURAPREP 26ML APPLICATOR (WOUND CARE) ×3 IMPLANT
ELECT REM PT RETURN 9FT ADLT (ELECTROSURGICAL) ×3
ELECTRODE REM PT RTRN 9FT ADLT (ELECTROSURGICAL) ×1 IMPLANT
GAUZE SPONGE 4X4 12PLY STRL (GAUZE/BANDAGES/DRESSINGS) ×2 IMPLANT
GAUZE XEROFORM 1X8 LF (GAUZE/BANDAGES/DRESSINGS) IMPLANT
GAUZE XEROFORM 5X9 LF (GAUZE/BANDAGES/DRESSINGS) ×2 IMPLANT
GLOVE BIO SURGEON STRL SZ 6 (GLOVE) ×2 IMPLANT
GLOVE BIOGEL PI IND STRL 7.0 (GLOVE) IMPLANT
GLOVE BIOGEL PI IND STRL 8 (GLOVE) ×2 IMPLANT
GLOVE BIOGEL PI INDICATOR 7.0 (GLOVE) ×2
GLOVE BIOGEL PI INDICATOR 8 (GLOVE) ×4
GLOVE ECLIPSE 7.5 STRL STRAW (GLOVE) ×6 IMPLANT
GLOVE SURG SS PI 7.0 STRL IVOR (GLOVE) ×2 IMPLANT
GOWN STRL REUS W/ TWL LRG LVL3 (GOWN DISPOSABLE) ×1 IMPLANT
GOWN STRL REUS W/ TWL XL LVL3 (GOWN DISPOSABLE) ×2 IMPLANT
GOWN STRL REUS W/TWL LRG LVL3 (GOWN DISPOSABLE) ×3
GOWN STRL REUS W/TWL XL LVL3 (GOWN DISPOSABLE) ×6
K-WIRE ACE 1.6X6 (WIRE) ×6
KIT BASIN OR (CUSTOM PROCEDURE TRAY) ×3 IMPLANT
KIT ROOM TURNOVER OR (KITS) ×3 IMPLANT
KWIRE ACE 1.6X6 (WIRE) IMPLANT
MANIFOLD NEPTUNE II (INSTRUMENTS) ×3 IMPLANT
NDL HYPO 25GX1X1/2 BEV (NEEDLE) IMPLANT
NEEDLE HYPO 25GX1X1/2 BEV (NEEDLE) ×3 IMPLANT
NS IRRIG 1000ML POUR BTL (IV SOLUTION) ×3 IMPLANT
PACK ORTHO EXTREMITY (CUSTOM PROCEDURE TRAY) ×3 IMPLANT
PAD ARMBOARD 7.5X6 YLW CONV (MISCELLANEOUS) ×6 IMPLANT
PAD CAST 4YDX4 CTTN HI CHSV (CAST SUPPLIES) IMPLANT
PADDING CAST COTTON 4X4 STRL (CAST SUPPLIES) ×9
PLATE ACE 100DEG 8HOLE (Plate) ×2 IMPLANT
SCREW ACE CAN 4.0 40M (Screw) ×2 IMPLANT
SCREW ACE CAN 4.0 44M (Screw) ×2 IMPLANT
SCREW ACE CAN 4.0 46M (Screw) ×2 IMPLANT
SCREW CORTICAL 3.5MM  12MM (Screw) ×8 IMPLANT
SCREW CORTICAL 3.5MM  16MM (Screw) ×2 IMPLANT
SCREW CORTICAL 3.5MM  20MM (Screw) ×2 IMPLANT
SCREW CORTICAL 3.5MM 12MM (Screw) IMPLANT
SCREW CORTICAL 3.5MM 16MM (Screw) IMPLANT
SCREW CORTICAL 3.5MM 20MM (Screw) IMPLANT
SCREW NLOCK CANC HEX 4X14 (Screw) ×4 IMPLANT
SCREW NLOCK CANC HEX 4X18 (Screw) ×3 IMPLANT
SCREW NLOCK CANC HEX 4X18 FIB (Screw) IMPLANT
SPLINT FIBERGLASS 4X30 (CAST SUPPLIES) ×4 IMPLANT
SPONGE LAP 18X18 X RAY DECT (DISPOSABLE) ×2 IMPLANT
SPONGE LAP 4X18 X RAY DECT (DISPOSABLE) ×6 IMPLANT
STAPLER VISISTAT 35W (STAPLE) ×2 IMPLANT
SUCTION FRAZIER HANDLE 10FR (MISCELLANEOUS) ×2
SUCTION TUBE FRAZIER 10FR DISP (MISCELLANEOUS) ×1 IMPLANT
SUT ETHILON 4 0 PS 2 18 (SUTURE) IMPLANT
SUT VIC AB 0 CTB1 27 (SUTURE) IMPLANT
SUT VIC AB 2-0 CT1 27 (SUTURE) ×3
SUT VIC AB 2-0 CT1 TAPERPNT 27 (SUTURE) IMPLANT
SUT VIC AB 2-0 FS1 27 (SUTURE) IMPLANT
SUT VIC AB 3-0 FS2 27 (SUTURE) ×2 IMPLANT
SYR CONTROL 10ML LL (SYRINGE) ×2 IMPLANT
TOWEL OR 17X24 6PK STRL BLUE (TOWEL DISPOSABLE) ×3 IMPLANT
TOWEL OR 17X26 10 PK STRL BLUE (TOWEL DISPOSABLE) ×3 IMPLANT
TUBE CONNECTING 12'X1/4 (SUCTIONS) ×1
TUBE CONNECTING 12X1/4 (SUCTIONS) ×2 IMPLANT
WATER STERILE IRR 1000ML POUR (IV SOLUTION) ×3 IMPLANT

## 2017-10-27 NOTE — ED Provider Notes (Signed)
[facility] Provider Note   CSN: [id number] Arrival date & time: 10/27/17  1256     History   Chief Complaint Chief Complaint  Patient presents with  . Fall    HPI [name] is a 77 y.o. female.  HPI Patient presents with right shoulder and right ankle pain after fall.  States she was walking in her Legs got weak and she fell.  Not on anticoagulation.  No neck pain.  No loss conscious.  No hip pain.  Has not been ambulatory.  No headache or confusion. Past Medical History:  Diagnosis Date  . Arthritis   . Chest pain    Hospital 03/2012  // outpatient nuclear normal 04/02/2012  EF  80%  . Diabetes mellitus   . GERD (gastroesophageal reflux disease)   . Hyperlipidemia   . Hypertension   . Obesity   . Stress at home     Patient Active Problem List   Diagnosis Date Noted  . Displaced trimalleolar fracture of right ankle 10/27/2017  . BMI 32.0-32.9,adult 04/02/2016  . Skin candidiasis 04/02/2016  . Chest pain   . Atypical chest pain 03/30/2012  . DM (diabetes mellitus), type 2, uncontrolled with complications (HCC) 03/30/2012  . Hypertension   . GERD (gastroesophageal reflux disease)   . Hyperlipidemia   . Arthritis     Past Surgical History:  Procedure Laterality Date  . ABDOMINAL HYSTERECTOMY    . BACK SURGERY    . ESOPHAGOGASTRODUODENOSCOPY N/A 03/07/2015   Procedure: ESOPHAGOGASTRODUODENOSCOPY (EGD);  Surgeon: Charolett Bumpers, MD;  Location: [facility];  Service: Endoscopy;  Laterality: N/A;  NO PROPOFOL  . SHOULDER SURGERY      OB History    No data available       Home Medications    Prior to Admission medications   Medication Sig Start Date End Date Taking? Authorizing Provider  allopurinol (ZYLOPRIM) 300 MG tablet Take 300 mg by mouth daily.    [provider]  amLODipine (NORVASC) 5 MG tablet TAKE 1 TABLET (5 MG TOTAL) BY MOUTH DAILY. 06/16/13   Luis Abed, MD  aspirin EC 81 MG tablet Take 1 tablet (81  mg total) by mouth daily. 10/28/17 10/28/18  Marshia Ly, PA-C  benazepril (LOTENSIN) 40 MG tablet Take 40 mg by mouth daily.    [provider]  cholecalciferol (VITAMIN D) 1000 UNITS tablet Take 1,000 Units by mouth daily.      [provider]  fenofibrate 160 MG tablet Take 160 mg by mouth daily. 08/03/15   [provider]  furosemide (LASIX) 20 MG tablet Take 30 mg by mouth daily. For fluid 12/20/14   [provider]  glimepiride (AMARYL) 4 MG tablet Take 8 mg by mouth daily. 12/20/14   [provider]  HYDROcodone-acetaminophen (NORCO) 5-325 MG tablet Take 1-2 tablets by mouth every 6 (six) hours as needed for moderate pain. 10/28/17   Marshia Ly, PA-C  hydrocortisone 1 % lotion Apply 1 application topically 2 (two) times daily. 04/02/16   Johna Sheriff, MD  Insulin Glargine (LANTUS) 100 UNIT/ML Solostar Pen Inject 35-50 Units into the skin as directed. 03/03/15   [provider]  levothyroxine (SYNTHROID, LEVOTHROID) 75 MCG tablet Take 75 mcg by mouth daily before breakfast. 08/11/14   [provider]  metFORMIN (GLUMETZA) 500 MG (MOD) 24 hr tablet Take 500 mg by mouth daily with breakfast.    [provider]  metoprolol succinate (TOPROL-XL) 100 MG 24  hr tablet Take 150 mg by mouth daily. Take 1 and 1/2 tablets by mouth once daily 02/01/15   [provider]  nitroGLYCERIN (NITROSTAT) 0.4 MG SL tablet Place 0.4 mg under the tongue as directed. Place one tablet under the tongue every 5 minutes as needed for chest pain 03/22/15   [provider]  nystatin ointment (MYCOSTATIN) Apply 1 application topically 2 (two) times daily. 04/02/16   Johna SheriffVincent, Carol L, MD  ondansetron (ZOFRAN) 4 MG tablet Take 4 mg by mouth 3 (three) times daily as needed. NAUSEA OR VOMITING 02/29/12   [provider]  pantoprazole (PROTONIX) 40 MG tablet Take 40 mg by mouth daily. 08/03/15   [provider]  potassium  chloride SA (K-DUR,KLOR-CON) 20 MEQ tablet Take 20 mEq by mouth 2 (two) times daily.    [provider]  pravastatin (PRAVACHOL) 80 MG tablet Take 80 mg by mouth daily. 04/09/14   [provider]    Family History Family History  Problem Relation Age of Onset  . Heart failure Mother   . Heart attack Father     Social History Social History   Tobacco Use  . Smoking status: Former Smoker    Packs/day: 0.30    Years: 0.00    Pack years: 0.00    Types: Cigarettes    Last attempt to quit: 03/30/1972    Years since quitting: 45.6  . Smokeless tobacco: Never Used  . Tobacco comment: Quit 40 years ago  Substance Use Topics  . Alcohol use: No  . Drug use: No     Allergies   Liraglutide; Niacin; Atorvastatin; Darvocet [propoxyphene n-acetaminophen]; Metformin and related; and Propoxyphene   Review of Systems Review of Systems  Constitutional: Negative for appetite change.  HENT: Negative for congestion.   Respiratory: Negative for shortness of breath.   Cardiovascular: Negative for chest pain.  Gastrointestinal: Negative for abdominal pain.  Genitourinary: Negative for flank pain.  Musculoskeletal: Negative for back pain and neck pain.       Right ankle and right shoulder pain.  Neurological: Negative for weakness and headaches.  Psychiatric/Behavioral: Negative for confusion.     Physical Exam Updated Vital Signs BP (!) 149/62   Pulse (!) 112   Temp (!) 97.5 F (36.4 C)   Resp 17   Wt 81.6 kg (180 lb)   SpO2 97%   BMI 29.05 kg/m   Physical Exam  Constitutional: She appears well-developed.  Patient sitting in the hallway.  Actively drinking Pepsi and has crumbs of crackers on her chest.  HENT:  Head: Atraumatic.  Eyes: EOM are normal.  Neck: Neck supple.  Cardiovascular: Normal rate.  Musculoskeletal: She exhibits tenderness.  Mild tenderness to lateral right shoulder.  Good range of motion.  Neurovascular intact in right hand.  No neck  tenderness.  There is deformity and small abrasion with some bruising on right ankle.  Sensation intact in foot.  Skin: Skin is warm. Capillary refill takes less than 2 seconds.     ED Treatments / Results  Labs (all labs ordered are listed, but only abnormal results are displayed) Labs Reviewed  BASIC METABOLIC PANEL - Abnormal; Notable for the following components:      Result Value   Glucose, Bld 208 (*)    BUN 32 (*)    Calcium 8.8 (*)    GFR calc non Af Amer 55 (*)    All other components within normal limits  CBC - Abnormal; Notable for the following  components:   WBC 16.6 (*)    All other components within normal limits  GLUCOSE, CAPILLARY - Abnormal; Notable for the following components:   Glucose-Capillary 160 (*)    All other components within normal limits  CBG MONITORING, ED    EKG  EKG Interpretation  Date/Time:  Sunday October 27 2017 13:15:13 EDT Ventricular Rate:  74 PR Interval:  196 QRS Duration: 70 QT Interval:  402 QTC Calculation: 446 R Axis:   8 Text Interpretation:  Normal sinus rhythm Cannot rule out Anterior infarct , age undetermined Abnormal ECG No significant change since last tracing Confirmed by Benjiman Core [phone]) on 10/27/2017 4:23:01 PM       Radiology Dg Shoulder Right  Result Date: 10/27/2017 CLINICAL DATA:  Fall, shoulder pain. EXAM: RIGHT SHOULDER - 2+ VIEW COMPARISON:  None. FINDINGS: Fixation hardware within the proximal right humerus appears intact and appropriately position. No osseous fracture line or displaced fracture fragment identified. Humeral head is grossly well positioned relative to the adjacent glenoid fossa. Soft tissues about the right shoulder are unremarkable. IMPRESSION: No acute findings. Fixation hardware within the proximal right humerus appears intact and appropriately positioned. Electronically Signed   By: Bary Richard M.D.   On: 10/27/2017 13:47   Dg Ankle Complete Right  Result Date:  10/27/2017 CLINICAL DATA:  77 year old female status post reduction of right ankle trimalleolar fracture. EXAM: RIGHT ANKLE - COMPLETE 3+ VIEW COMPARISON:  Earlier radiograph dated 10/27/2017 FINDINGS: Trimalleolar fracture again noted. There is displaced and mildly angulated fractures of the distal fibula and posterior malleolus. There is persistent dorsal dislocation of the ankle with medial tilt of the talar dome and asymmetry of the joint space at the ankle mortise. A fiberglass cast is noted. IMPRESSION: Trimalleolar fracture with persistent posteriorly dislocated ankle. Electronically Signed   By: Elgie Collard M.D.   On: 10/27/2017 19:34   Dg Ankle Complete Right  Result Date: 10/27/2017 CLINICAL DATA:  Fall, right shoulder and ankle pain. EXAM: RIGHT ANKLE - COMPLETE 3+ VIEW COMPARISON:  None. FINDINGS: Displaced fracture within the medial malleolus. Additional displaced fracture within the distal fibula, obliquely oriented, centered at the upper margin of the lateral malleolus. Based on the lateral view, the distal tibia appears to be anteriorly subluxed relative to the talar dome. Ankle mortise is otherwise grossly symmetric and talar dome appears intact. Visualized portions of the hindfoot and midfoot appear intact and normally aligned. IMPRESSION: 1. Displaced fractures within the medial malleolus and distal fibula. 2. Based on the lateral view, distal tibia appears to be anteriorly displaced relative to the talar dome. Ankle mortise otherwise grossly symmetric. Electronically Signed   By: Bary Richard M.D.   On: 10/27/2017 13:46   Dg Ankle Right Port  Result Date: 10/27/2017 CLINICAL DATA:  Post reduction. EXAM: PORTABLE RIGHT ANKLE - 2 VIEW COMPARISON:  October 27, 2017 FINDINGS: A single lateral view was obtained. The displaced fibular fracture remains similar in the interval. There appears to be a posterior malleolus fracture is well, likely not changed. The tibia is displaced anteriorly  relative to the talus, slightly improved. A medial malleolus fracture is identified. IMPRESSION: Tri malleolar fractures with continued displacement. The tibia remains mildly anteriorly displaced relative to the talus, mildly improved. Electronically Signed   By: Gerome Sam III M.D   On: 10/27/2017 18:04    Procedures Sedation procedure Date/Time: 10/28/2017 12:22 AM Performed by: Benjiman Core, MD Authorized by: Benjiman Core, MD   Consent:    Consent  obtained:  Verbal   Consent given by:  Patient Universal protocol:    Immediately prior to procedure a time out was called: yes     Patient identity confirmation method:  Anonymous protocol, patient vented/unresponsive Indications:    Procedure performed:  Fracture reduction   Procedure necessitating sedation performed by:  Physician performing sedation   Intended level of sedation:  Moderate (conscious sedation) Pre-sedation assessment:    Time since last food or drink:  2 hrs   NPO status caution: urgency dictates proceeding with non-ideal NPO status     ASA classification: class 3 - patient with severe systemic disease     Mallampati score:  II - soft palate, uvula, fauces visible   Pre-sedation assessments completed and reviewed: airway patency, cardiovascular function, mental status and respiratory function   Immediate pre-procedure details:    Reviewed: vital signs     Verified: bag valve mask available, emergency equipment available, intubation equipment available and oxygen available   Procedure details (see MAR for exact dosages):    Preoxygenation:  Nasal cannula   Sedation:  Propofol   Intra-procedure monitoring:  Blood pressure monitoring, continuous capnometry, continuous pulse oximetry, frequent LOC assessments, frequent vital sign checks and cardiac monitor   Intra-procedure events: none     Total Provider sedation time (minutes):  10 Post-procedure details:    Attendance: Constant attendance by certified  staff until patient recovered     Recovery: Patient returned to pre-procedure baseline     Patient is stable for discharge or admission: yes     Patient tolerance:  Tolerated well, no immediate complications Reduction of dislocation Date/Time: 10/28/2017 12:24 AM Performed by: Benjiman Core, MD Authorized by: Benjiman Core, MD  Consent: Verbal consent obtained. Written consent not obtained. Risks and benefits: risks, benefits and alternatives were discussed Consent given by: patient Patient identity confirmed: verbally with patient, arm band and provided demographic data Time out: Immediately prior to procedure a "time out" was called to verify the correct patient, procedure, equipment, support staff and site/side marked as required. Local anesthesia used: no  Anesthesia: Local anesthesia used: no  Sedation: Patient sedated: yes  Patient tolerance: Patient tolerated the procedure well with no immediate complications Comments: Attempted this location/fracture reduction.  Had some improvement of alignment but not full reduction of dislocation.  Tolerated procedure well.    (including critical care time)  Medications Ordered in ED Medications  fentaNYL (SUBLIMAZE) injection 25-50 mcg (not administered)  fentaNYL (SUBLIMAZE) injection 100 mcg (100 mcg Intravenous Given 10/27/17 1725)  ondansetron (ZOFRAN) injection 4 mg (4 mg Intravenous Given 10/27/17 1730)  promethazine (PHENERGAN) injection 6.25 mg (6.25 mg Intravenous Given 10/27/17 1841)  propofol (DIPRIVAN) 10 mg/mL bolus/IV push 40.8 mg (40.8 mg Intravenous Given 10/27/17 1852)     Initial Impression / Assessment and Plan / ED Course  I have reviewed the triage vital signs and the nursing notes.  Pertinent labs & imaging results that were available during my care of the patient were reviewed by me and considered in my medical decision making (see chart for details).     Patient presented after fall.  Trimalleolar  ankle fracture dislocation.  Attempted dislocation reduction twice without success in the ER.  Discussed with orthopedic surgery.  Who will admit the patient for OR reduction and surgery.    Final Clinical Impressions(s) / ED Diagnoses   Final diagnoses:  Ankle dislocation, right, subsequent encounter    ED Discharge Orders        Ordered  aspirin EC 81 MG tablet  Daily     10/28/17 0011    HYDROcodone-acetaminophen (NORCO) 5-325 MG tablet  Every 6 hours PRN     10/28/17 0011    Non weight bearing     10/28/17 0012    Consult to diabetes coordinator    Provider:  (Not yet assigned)   10/28/17 0012       Benjiman Core, MD 10/28/17 [phone]

## 2017-10-27 NOTE — H&P (Signed)
PREOPERATIVE H&P  Chief Complaint: r ankle pain  HPI: [name] is a 77 y.o. female who presents for evaluation of r ankle pain with fracture dislocation. It has been present for several hours and has been worsening. She has failed conservative measures. Pain is rated as moderate.  Past Medical History:  Diagnosis Date  . Arthritis   . Chest pain    Hospital 03/2012  // outpatient nuclear normal 04/02/2012  EF  80%  . Diabetes mellitus   . GERD (gastroesophageal reflux disease)   . Hyperlipidemia   . Hypertension   . Obesity   . Stress at home    Past Surgical History:  Procedure Laterality Date  . ABDOMINAL HYSTERECTOMY    . BACK SURGERY    . ESOPHAGOGASTRODUODENOSCOPY N/A 03/07/2015   Procedure: ESOPHAGOGASTRODUODENOSCOPY (EGD);  Surgeon: Charolett BumpersMartin K Johnson, MD;  Location: [facility];  Service: Endoscopy;  Laterality: N/A;  NO PROPOFOL  . SHOULDER SURGERY     Social History   Socioeconomic History  . Marital status: Widowed    Spouse name: None  . Number of children: None  . Years of education: None  . Highest education level: None  Social Needs  . Financial resource strain: None  . Food insecurity - worry: None  . Food insecurity - inability: None  . Transportation needs - medical: None  . Transportation needs - non-medical: None  Occupational History  . None  Tobacco Use  . Smoking status: Former Smoker    Packs/day: 0.30    Years: 0.00    Pack years: 0.00    Types: Cigarettes    Last attempt to quit: 03/30/1972    Years since quitting: 45.6  . Smokeless tobacco: Never Used  . Tobacco comment: Quit 40 years ago  Substance and Sexual Activity  . Alcohol use: No  . Drug use: No  . Sexual activity: No  Other Topics Concern  . None  Social History Narrative   [place] in [place], [place] with granddaughter.    Family History  Problem Relation Age of Onset  . Heart failure Mother   . Heart attack Father    Allergies  Allergen Reactions  . Liraglutide  Nausea And Vomiting  . Niacin Nausea And Vomiting  . Atorvastatin   . Darvocet [Propoxyphene N-Acetaminophen]   . Metformin And Related Nausea Only  . Propoxyphene Nausea Only   Prior to Admission medications   Medication Sig Start Date End Date Taking? Authorizing Provider  allopurinol (ZYLOPRIM) 300 MG tablet Take 300 mg by mouth daily.    [provider]  amLODipine (NORVASC) 5 MG tablet TAKE 1 TABLET (5 MG TOTAL) BY MOUTH DAILY. 06/16/13   Luis AbedKatz, Jeffrey D, MD  benazepril (LOTENSIN) 40 MG tablet Take 40 mg by mouth daily.    [provider]  cholecalciferol (VITAMIN D) 1000 UNITS tablet Take 1,000 Units by mouth daily.      [provider]  fenofibrate 160 MG tablet Take 160 mg by mouth daily. 08/03/15   [provider]  furosemide (LASIX) 20 MG tablet Take 30 mg by mouth daily. For fluid 12/20/14   [provider]  glimepiride (AMARYL) 4 MG tablet Take 8 mg by mouth daily. 12/20/14   [provider]  hydrocortisone 1 % lotion Apply 1 application topically 2 (two) times daily. 04/02/16   Johna SheriffVincent, Carol L, MD  Insulin Glargine (LANTUS) 100 UNIT/ML Solostar Pen Inject 35-50 Units into the skin as directed. 03/03/15   [provider]  levothyroxine (SYNTHROID, LEVOTHROID) 75 MCG tablet Take 75 mcg by mouth daily before breakfast. 08/11/14   [provider]  metFORMIN (GLUMETZA) 500 MG (MOD) 24 hr tablet Take 500 mg by mouth daily with breakfast.    [provider]  metoprolol succinate (TOPROL-XL) 100 MG 24 hr tablet Take 150 mg by mouth daily. Take 1 and 1/2 tablets by mouth once daily 02/01/15   [provider]  nitroGLYCERIN (NITROSTAT) 0.4 MG SL tablet Place 0.4 mg under the tongue as directed. Place one tablet under the tongue every 5 minutes as needed for chest pain 03/22/15   [provider]  nystatin ointment (MYCOSTATIN) Apply 1 application topically 2 (two) times daily. 04/02/16   Johna Sheriff, MD  ondansetron (ZOFRAN) 4 MG tablet Take 4 mg by mouth 3 (three) times daily as needed. NAUSEA OR VOMITING 02/29/12   [provider]  pantoprazole (PROTONIX) 40 MG tablet Take 40 mg by mouth daily. 08/03/15   [provider]  potassium chloride SA (K-DUR,KLOR-CON) 20 MEQ tablet Take 20 mEq by mouth 2 (two) times daily.    [provider]  pravastatin (PRAVACHOL) 80 MG tablet Take 80 mg by mouth daily. 04/09/14   [provider]     Positive ROS: none  All other systems have been reviewed and were otherwise negative with the exception of those mentioned in the HPI and as above.  Physical Exam: Vitals:   10/27/17 2000 10/27/17 2045  BP: 131/71 126/70  Pulse: 73 74  Resp: 16 20  Temp:    SpO2: 98% 98%    General: Alert, no acute distress Cardiovascular: No pedal edema Respiratory: No cyanosis, no use of accessory musculature GI: No organomegaly, abdomen is soft and non-tender Skin: No lesions in the area of chief complaint Neurologic: Sensation intact distally Psychiatric: Patient is competent for consent with normal mood and affect Lymphatic: No axillary or cervical lymphadenopathy  MUSCULOSKELETAL: r ankle +stswelling over ankle Pain on all rom nvi distally  Assessment/Plan: Right Ankle Fracture Plan for Procedure(s): OPEN REDUCTION INTERNAL FIXATION (ORIF) ANKLE FRACTURE  The risks benefits and alternatives were discussed with the patient including but not limited to the risks of nonoperative treatment, versus surgical intervention including infection, bleeding, nerve injury, malunion, nonunion, hardware prominence, hardware failure, need for hardware removal, blood clots, cardiopulmonary complications, morbidity, mortality, among others, and they were willing to proceed.  Predicted outcome is good, although there will be at least a six to nine month expected recovery.  Harvie Junior, MD 10/27/2017 9:26 PM

## 2017-10-27 NOTE — Brief Op Note (Signed)
10/27/2017  11:28 PM  PATIENT:  [name]  77 y.o. female  PRE-OPERATIVE DIAGNOSIS:  Right Ankle Fracture  POST-OPERATIVE DIAGNOSIS:  RIGHT ANKLE FRACTURE  PROCEDURE:  Procedure(s): OPEN REDUCTION INTERNAL FIXATION (ORIF) ANKLE FRACTURE (Right)  SURGEON:  Surgeon(s) and Role:    Jodi Geralds* Jermaine Tholl, MD - Primary  PHYSICIAN ASSISTANT:   ASSISTANTS: bethune   ANESTHESIA:   general  EBL:  none   BLOOD ADMINISTERED:none  DRAINS: none   LOCAL MEDICATIONS USED:  MARCAINE     SPECIMEN:  No Specimen  DISPOSITION OF SPECIMEN:  N/A  COUNTS:  YES  TOURNIQUET:  * Missing tourniquet times found for documented tourniquets in log: [id number] *  DICTATION: .Other Dictation: Dictation Number [id number]  PLAN OF CARE: Admit to inpatient   PATIENT DISPOSITION:  PACU - hemodynamically stable.   Delay start of Pharmacological VTE agent (>24hrs) due to surgical blood loss or risk of bleeding: no

## 2017-10-27 NOTE — ED Triage Notes (Signed)
Per EMS- pt was attempted to walk up on steps and had a fall. Has pain to right shoulder, right ankle. Pt unsure if she had LOC or hit head. Not on blood thinners.

## 2017-10-27 NOTE — Anesthesia Preprocedure Evaluation (Addendum)
Anesthesia Evaluation  Patient identified by MRN, date of birth, ID band Patient awake    Reviewed: Allergy & Precautions, NPO status , Patient's Chart, lab work & pertinent test results  Airway Mallampati: II  TM Distance: >3 FB     Dental   Pulmonary former smoker,    breath sounds clear to auscultation       Cardiovascular hypertension,  Rhythm:Regular Rate:Normal     Neuro/Psych    GI/Hepatic Neg liver ROS, GERD  ,  Endo/Other  diabetes  Renal/GU      Musculoskeletal   Abdominal   Peds  Hematology   Anesthesia Other Findings   Reproductive/Obstetrics                             Anesthesia Physical Anesthesia Plan  ASA: III  Anesthesia Plan: General   Post-op Pain Management:    Induction:   PONV Risk Score and Plan: 2 and Treatment may vary due to age or medical condition  Airway Management Planned: Oral ETT  Additional Equipment:   Intra-op Plan:   Post-operative Plan: Possible Post-op intubation/ventilation  Informed Consent: I have reviewed the patients History and Physical, chart, labs and discussed the procedure including the risks, benefits and alternatives for the proposed anesthesia with the patient or authorized representative who has indicated his/her understanding and acceptance.   Dental advisory given  Plan Discussed with: CRNA and Anesthesiologist  Anesthesia Plan Comments:         Anesthesia Quick Evaluation

## 2017-10-27 NOTE — ED Notes (Signed)
Ortho tech paged  

## 2017-10-27 NOTE — ED Notes (Signed)
Patient transported to X-ray 

## 2017-10-27 NOTE — ED Notes (Addendum)
Conscious sedation indicated for pt due to fracture per Dr Rubin PayorPickering. Asher MuirJamie, CN aware.

## 2017-10-27 NOTE — ED Notes (Signed)
Lilli LightVernon Smith called and needs someone to call and let him and his wife know if patient will be kept or released overnight. [phone]. They help take care of the patient.

## 2017-10-27 NOTE — Progress Notes (Signed)
Orthopedic Tech Progress Note Patient Details:  Meryl CrutchLucille D Bangura Jul 02, 1941 [id number]  Ortho Devices Type of Ortho Device: Ace wrap, Short leg splint, Stirrup splint Ortho Device/Splint Interventions: Application   Post Interventions Patient Tolerated: Well Instructions Provided: Care of device   Saul FordyceJennifer C Makayle Krahn 10/27/2017, 5:14 PM

## 2017-10-27 NOTE — Anesthesia Procedure Notes (Signed)
Procedure Name: Intubation Date/Time: 10/27/2017 10:39 PM Performed by: Babs Bertin, CRNA Pre-anesthesia Checklist: Patient identified, Emergency Drugs available, Suction available and Patient being monitored Patient Re-evaluated:Patient Re-evaluated prior to induction Oxygen Delivery Method: Circle System Utilized Preoxygenation: Pre-oxygenation with 100% oxygen Induction Type: IV induction, Rapid sequence and Cricoid Pressure applied Laryngoscope Size: Mac and 3 Grade View: Grade I Tube type: Oral Tube size: 7.0 mm Number of attempts: 1 Airway Equipment and Method: Stylet and Oral airway Placement Confirmation: ETT inserted through vocal cords under direct vision,  positive ETCO2 and breath sounds checked- equal and bilateral Secured at: 21 cm Tube secured with: Tape Dental Injury: Teeth and Oropharynx as per pre-operative assessment

## 2017-10-28 ENCOUNTER — Encounter (HOSPITAL_COMMUNITY): Payer: Self-pay | Admitting: Orthopedic Surgery

## 2017-10-28 DIAGNOSIS — M1990 Unspecified osteoarthritis, unspecified site: Secondary | ICD-10-CM | POA: Diagnosis present

## 2017-10-28 DIAGNOSIS — Z885 Allergy status to narcotic agent status: Secondary | ICD-10-CM | POA: Diagnosis not present

## 2017-10-28 DIAGNOSIS — E669 Obesity, unspecified: Secondary | ICD-10-CM | POA: Diagnosis present

## 2017-10-28 DIAGNOSIS — S82851A Displaced trimalleolar fracture of right lower leg, initial encounter for closed fracture: Secondary | ICD-10-CM | POA: Diagnosis present

## 2017-10-28 DIAGNOSIS — I10 Essential (primary) hypertension: Secondary | ICD-10-CM | POA: Diagnosis present

## 2017-10-28 DIAGNOSIS — Z794 Long term (current) use of insulin: Secondary | ICD-10-CM | POA: Diagnosis not present

## 2017-10-28 DIAGNOSIS — Z6832 Body mass index (BMI) 32.0-32.9, adult: Secondary | ICD-10-CM | POA: Diagnosis not present

## 2017-10-28 DIAGNOSIS — W1830XA Fall on same level, unspecified, initial encounter: Secondary | ICD-10-CM | POA: Diagnosis present

## 2017-10-28 DIAGNOSIS — K219 Gastro-esophageal reflux disease without esophagitis: Secondary | ICD-10-CM | POA: Diagnosis present

## 2017-10-28 DIAGNOSIS — Z87891 Personal history of nicotine dependence: Secondary | ICD-10-CM | POA: Diagnosis not present

## 2017-10-28 DIAGNOSIS — Z888 Allergy status to other drugs, medicaments and biological substances status: Secondary | ICD-10-CM | POA: Diagnosis not present

## 2017-10-28 DIAGNOSIS — R52 Pain, unspecified: Secondary | ICD-10-CM | POA: Diagnosis present

## 2017-10-28 DIAGNOSIS — Z79890 Hormone replacement therapy (postmenopausal): Secondary | ICD-10-CM | POA: Diagnosis not present

## 2017-10-28 DIAGNOSIS — E785 Hyperlipidemia, unspecified: Secondary | ICD-10-CM | POA: Diagnosis present

## 2017-10-28 DIAGNOSIS — Z90710 Acquired absence of both cervix and uterus: Secondary | ICD-10-CM | POA: Diagnosis not present

## 2017-10-28 DIAGNOSIS — Z7982 Long term (current) use of aspirin: Secondary | ICD-10-CM | POA: Diagnosis not present

## 2017-10-28 DIAGNOSIS — Z8249 Family history of ischemic heart disease and other diseases of the circulatory system: Secondary | ICD-10-CM | POA: Diagnosis not present

## 2017-10-28 DIAGNOSIS — E119 Type 2 diabetes mellitus without complications: Secondary | ICD-10-CM | POA: Diagnosis present

## 2017-10-28 LAB — GLUCOSE, CAPILLARY
GLUCOSE-CAPILLARY: 160 mg/dL — AB (ref 65–99)
GLUCOSE-CAPILLARY: 164 mg/dL — AB (ref 65–99)
GLUCOSE-CAPILLARY: 216 mg/dL — AB (ref 65–99)
GLUCOSE-CAPILLARY: 228 mg/dL — AB (ref 65–99)
Glucose-Capillary: 150 mg/dL — ABNORMAL HIGH (ref 65–99)

## 2017-10-28 LAB — BASIC METABOLIC PANEL
Anion gap: 10 (ref 5–15)
BUN: 27 mg/dL — AB (ref 6–20)
CALCIUM: 8.4 mg/dL — AB (ref 8.9–10.3)
CHLORIDE: 101 mmol/L (ref 101–111)
CO2: 25 mmol/L (ref 22–32)
CREATININE: 0.85 mg/dL (ref 0.44–1.00)
GFR calc Af Amer: >60 mL/min (ref >60)
GFR calc non Af Amer: >60 mL/min (ref >60)
Glucose, Bld: 158 mg/dL — ABNORMAL HIGH (ref 65–99)
Potassium: 4.9 mmol/L (ref 3.5–5.1)
SODIUM: 136 mmol/L (ref 135–145)

## 2017-10-28 MED ORDER — CEFAZOLIN SODIUM-DEXTROSE 2-4 GM/100ML-% IV SOLN
2.0000 g | Freq: Four times a day (QID) | INTRAVENOUS | Status: AC
  Administered 2017-10-28 (×3): 2 g via INTRAVENOUS
  Filled 2017-10-28 (×3): qty 100

## 2017-10-28 MED ORDER — METOPROLOL SUCCINATE ER 50 MG PO TB24
150.0000 mg | ORAL_TABLET | Freq: Every day | ORAL | Status: DC
  Administered 2017-10-28 – 2017-10-31 (×4): 150 mg via ORAL
  Filled 2017-10-28 (×4): qty 1

## 2017-10-28 MED ORDER — INSULIN GLARGINE 100 UNIT/ML ~~LOC~~ SOLN
20.0000 [IU] | Freq: Every day | SUBCUTANEOUS | Status: DC
  Administered 2017-10-28 – 2017-10-30 (×3): 20 [IU] via SUBCUTANEOUS
  Filled 2017-10-28 (×4): qty 0.2

## 2017-10-28 MED ORDER — MORPHINE SULFATE (PF) 2 MG/ML IV SOLN: 0.5000 mg | INTRAVENOUS | Status: DC | PRN

## 2017-10-28 MED ORDER — MAGNESIUM CITRATE PO SOLN
1.0000 | Freq: Once | ORAL | Status: AC | PRN
  Administered 2017-10-31: 1 via ORAL
  Filled 2017-10-28: qty 296

## 2017-10-28 MED ORDER — FENOFIBRATE 160 MG PO TABS
160.0000 mg | ORAL_TABLET | Freq: Every day | ORAL | Status: DC
  Administered 2017-10-28 – 2017-10-31 (×4): 160 mg via ORAL
  Filled 2017-10-28 (×4): qty 1

## 2017-10-28 MED ORDER — CHLORHEXIDINE GLUCONATE 4 % EX LIQD: 60.0000 mL | Freq: Once | CUTANEOUS | Status: DC

## 2017-10-28 MED ORDER — BENAZEPRIL HCL 40 MG PO TABS
40.0000 mg | ORAL_TABLET | Freq: Every day | ORAL | Status: DC
  Administered 2017-10-28 – 2017-10-31 (×4): 40 mg via ORAL
  Filled 2017-10-28: qty 2
  Filled 2017-10-28 (×2): qty 1
  Filled 2017-10-28: qty 2
  Filled 2017-10-28: qty 1
  Filled 2017-10-28: qty 2
  Filled 2017-10-28: qty 1
  Filled 2017-10-28: qty 2

## 2017-10-28 MED ORDER — FENTANYL CITRATE (PF) 100 MCG/2ML IJ SOLN: 25.0000 ug | INTRAMUSCULAR | Status: DC | PRN

## 2017-10-28 MED ORDER — HYDROCODONE-ACETAMINOPHEN 5-325 MG PO TABS
1.0000 | ORAL_TABLET | ORAL | Status: DC | PRN
  Administered 2017-10-28 – 2017-10-29 (×2): 1 via ORAL
  Administered 2017-10-29: 2 via ORAL
  Administered 2017-10-29 – 2017-10-30 (×2): 1 via ORAL
  Administered 2017-10-30 – 2017-10-31 (×4): 2 via ORAL
  Filled 2017-10-28: qty 2
  Filled 2017-10-28: qty 1
  Filled 2017-10-28 (×4): qty 2
  Filled 2017-10-28 (×2): qty 1
  Filled 2017-10-28: qty 2
  Filled 2017-10-28: qty 1

## 2017-10-28 MED ORDER — PANTOPRAZOLE SODIUM 40 MG PO TBEC
40.0000 mg | DELAYED_RELEASE_TABLET | Freq: Every day | ORAL | Status: DC
  Administered 2017-10-28 – 2017-10-31 (×4): 40 mg via ORAL
  Filled 2017-10-28 (×4): qty 1

## 2017-10-28 MED ORDER — METHOCARBAMOL 1000 MG/10ML IJ SOLN: 500.0000 mg | Freq: Four times a day (QID) | INTRAVENOUS | Status: DC | PRN

## 2017-10-28 MED ORDER — METHOCARBAMOL 500 MG PO TABS
500.0000 mg | ORAL_TABLET | Freq: Four times a day (QID) | ORAL | Status: DC | PRN
Start: 2017-10-28 — End: 2017-10-31
  Administered 2017-10-28 – 2017-10-30 (×5): 500 mg via ORAL
  Filled 2017-10-28 (×5): qty 1

## 2017-10-28 MED ORDER — ASPIRIN EC 325 MG PO TBEC
325.0000 mg | DELAYED_RELEASE_TABLET | Freq: Every day | ORAL | Status: DC
  Administered 2017-10-29 – 2017-10-31 (×3): 325 mg via ORAL
  Filled 2017-10-28 (×3): qty 1

## 2017-10-28 MED ORDER — LEVOTHYROXINE SODIUM 75 MCG PO TABS
75.0000 ug | ORAL_TABLET | Freq: Every day | ORAL | Status: DC
  Administered 2017-10-28 – 2017-10-31 (×4): 75 ug via ORAL
  Filled 2017-10-28 (×4): qty 1

## 2017-10-28 MED ORDER — DOCUSATE SODIUM 100 MG PO CAPS
100.0000 mg | ORAL_CAPSULE | Freq: Two times a day (BID) | ORAL | Status: DC
  Administered 2017-10-28 – 2017-10-31 (×8): 100 mg via ORAL
  Filled 2017-10-28 (×8): qty 1

## 2017-10-28 MED ORDER — GLIMEPIRIDE 4 MG PO TABS
8.0000 mg | ORAL_TABLET | Freq: Every day | ORAL | Status: DC
  Administered 2017-10-28 – 2017-10-31 (×4): 8 mg via ORAL
  Filled 2017-10-28 (×5): qty 2

## 2017-10-28 MED ORDER — ONDANSETRON HCL 4 MG PO TABS: 4.0000 mg | ORAL_TABLET | Freq: Four times a day (QID) | ORAL | Status: DC | PRN

## 2017-10-28 MED ORDER — AMLODIPINE BESYLATE 5 MG PO TABS
5.0000 mg | ORAL_TABLET | Freq: Every day | ORAL | Status: DC
  Administered 2017-10-28 – 2017-10-31 (×4): 5 mg via ORAL
  Filled 2017-10-28 (×4): qty 1

## 2017-10-28 MED ORDER — NITROGLYCERIN 0.4 MG SL SUBL: 0.4000 mg | SUBLINGUAL_TABLET | SUBLINGUAL | Status: DC | PRN

## 2017-10-28 MED ORDER — INSULIN ASPART 100 UNIT/ML ~~LOC~~ SOLN
0.0000 [IU] | Freq: Three times a day (TID) | SUBCUTANEOUS | Status: DC
  Administered 2017-10-28: 3 [IU] via SUBCUTANEOUS
  Administered 2017-10-28 (×2): 5 [IU] via SUBCUTANEOUS
  Administered 2017-10-29: 2 [IU] via SUBCUTANEOUS
  Administered 2017-10-29: 3 [IU] via SUBCUTANEOUS
  Administered 2017-10-29 – 2017-10-30 (×2): 2 [IU] via SUBCUTANEOUS
  Administered 2017-10-30: 3 [IU] via SUBCUTANEOUS
  Administered 2017-10-31: 2 [IU] via SUBCUTANEOUS

## 2017-10-28 MED ORDER — METFORMIN HCL ER 500 MG PO TB24: 500.0000 mg | ORAL_TABLET | Freq: Every day | ORAL | Status: DC

## 2017-10-28 MED ORDER — ALLOPURINOL 300 MG PO TABS
300.0000 mg | ORAL_TABLET | Freq: Every day | ORAL | Status: DC
  Administered 2017-10-28 – 2017-10-31 (×4): 300 mg via ORAL
  Filled 2017-10-28 (×4): qty 1

## 2017-10-28 MED ORDER — SODIUM CHLORIDE 0.9 % IV SOLN
INTRAVENOUS | Status: DC
  Administered 2017-10-28: 50 mL/h via INTRAVENOUS

## 2017-10-28 MED ORDER — FUROSEMIDE 20 MG PO TABS
30.0000 mg | ORAL_TABLET | Freq: Every day | ORAL | Status: DC
  Administered 2017-10-28 – 2017-10-31 (×4): 30 mg via ORAL
  Filled 2017-10-28 (×4): qty 2

## 2017-10-28 MED ORDER — ACETAMINOPHEN 500 MG PO TABS
500.0000 mg | ORAL_TABLET | Freq: Four times a day (QID) | ORAL | Status: AC
  Administered 2017-10-28 (×3): 500 mg via ORAL
  Filled 2017-10-28 (×3): qty 1

## 2017-10-28 MED ORDER — PRAVASTATIN SODIUM 80 MG PO TABS
80.0000 mg | ORAL_TABLET | Freq: Every day | ORAL | Status: DC
  Administered 2017-10-28 – 2017-10-30 (×3): 80 mg via ORAL
  Filled 2017-10-28 (×3): qty 1

## 2017-10-28 MED ORDER — BISACODYL 5 MG PO TBEC: 5.0000 mg | DELAYED_RELEASE_TABLET | Freq: Every day | ORAL | Status: DC | PRN

## 2017-10-28 MED ORDER — POVIDONE-IODINE 10 % EX SWAB: 2.0000 "application " | Freq: Once | CUTANEOUS | Status: DC

## 2017-10-28 MED ORDER — ONDANSETRON HCL 4 MG/2ML IJ SOLN
4.0000 mg | Freq: Four times a day (QID) | INTRAMUSCULAR | Status: DC | PRN
  Administered 2017-10-28 – 2017-10-29 (×2): 4 mg via INTRAVENOUS
  Filled 2017-10-28 (×2): qty 2

## 2017-10-28 MED ORDER — HYDROCODONE-ACETAMINOPHEN 5-325 MG PO TABS: 1.0000 | ORAL_TABLET | Freq: Four times a day (QID) | ORAL | 0 refills | Status: DC | PRN

## 2017-10-28 MED ORDER — CEFAZOLIN SODIUM-DEXTROSE 2-4 GM/100ML-% IV SOLN: 2.0000 g | INTRAVENOUS | Status: DC

## 2017-10-28 MED ORDER — POLYETHYLENE GLYCOL 3350 17 G PO PACK: 17.0000 g | PACK | Freq: Every day | ORAL | Status: DC | PRN

## 2017-10-28 MED ORDER — ASPIRIN EC 81 MG PO TBEC: 81.0000 mg | DELAYED_RELEASE_TABLET | Freq: Every day | ORAL | 2 refills | Status: AC

## 2017-10-28 MED ORDER — POTASSIUM CHLORIDE CRYS ER 20 MEQ PO TBCR: 20.0000 meq | EXTENDED_RELEASE_TABLET | Freq: Two times a day (BID) | ORAL | Status: DC

## 2017-10-28 NOTE — Transfer of Care (Signed)
Immediate Anesthesia Transfer of Care Note  Patient: [name]  Procedure(s) Performed: OPEN REDUCTION INTERNAL FIXATION (ORIF) ANKLE FRACTURE (Right Leg Lower)  Patient Location: PACU  Anesthesia Type:General  Level of Consciousness: awake, alert  and oriented  Airway & Oxygen Therapy: Patient Spontanous Breathing and Patient connected to nasal cannula oxygen  Post-op Assessment: Report given to RN and Post -op Vital signs reviewed and stable  Post vital signs: Reviewed and stable  Last Vitals:  Vitals:   10/27/17 2357 10/28/17 0000  BP: (!) 167/73   Pulse: (!) 115 (!) 117  Resp: 14 16  Temp: (!) 36.4 C   SpO2: 95% 94%    Last Pain:  Vitals:   10/27/17 1936  TempSrc:   PainSc: 5       Patients Stated Pain Goal: 4 (10/27/17 1936)  Complications: No apparent anesthesia complications

## 2017-10-28 NOTE — Progress Notes (Signed)
Subjective: 1 Day Post-Op Procedure(s) (LRB): OPEN REDUCTION INTERNAL FIXATION (ORIF) ANKLE FRACTURE (Right) Patient reports pain as mild.    Objective: Vital signs in last 24 hours: Temp:  [97.5 F (36.4 C)-97.9 F (36.6 C)] 97.7 F (36.5 C) (03/11 0043) Pulse Rate:  [66-117] 103 (03/11 0043) Resp:  [14-26] 16 (03/11 0043) BP: (118-167)/(62-80) 150/76 (03/11 0043) SpO2:  [94 %-100 %] 100 % (03/11 0043) Weight:  [81.6 kg (179 lb 14.3 oz)-81.6 kg (180 lb)] 81.6 kg (179 lb 14.3 oz) (03/11 0100)  Intake/Output from previous day: 03/10 0701 - 03/11 0700 In: 1318.3 [P.O.:100; I.V.:1118.3; IV Piggyback:100] Out: 50 [Blood:50] Intake/Output this shift: Total I/O In: 240 [P.O.:240] Out: -   Recent Labs    10/27/17 1316  HGB 13.3   Recent Labs    10/27/17 1316  WBC 16.6*  RBC 4.41  HCT 42.4  PLT 286   Recent Labs    10/27/17 1316 10/28/17 0531  NA 137 136  K 3.8 4.9  CL 102 101  CO2 22 25  BUN 32* 27*  CREATININE 0.97 0.85  GLUCOSE 208* 158*  CALCIUM 8.8* 8.4*   No results for input(s): LABPT, INR in the last 72 hours.  Neurologically intact ABD soft Neurovascular intact Sensation intact distally No cellulitis present Compartment soft  Assessment/Plan: 1 Day Post-Op Procedure(s) (LRB): OPEN REDUCTION INTERNAL FIXATION (ORIF) ANKLE FRACTURE (Right) Advance diet Up with therapy  Will need eval by PT and social work but she feels she will need SNF.  Harvie JuniorJohn L Alizza Sacra 10/28/2017, 9:07 AM

## 2017-10-28 NOTE — Clinical Social Work Note (Signed)
Clinical Social Work Assessment  Patient Details  Name: [name] MRN: [id number] Date of Birth: 09-Jul-1941  Date of referral:  10/28/17               Reason for consult:  Facility Placement                Permission sought to share information with:  Facility Industrial/product designerContact Representative Permission granted to share information::  Yes, Verbal Permission Granted  Name::        Agency::  SNF  Relationship::     Contact Information:     Housing/Transportation Living arrangements for the past 2 months:  Single Family Home Source of Information:  Patient, Adult Children, Other (Comment Required)(granddaughter) Patient Interpreter Needed:  None Criminal Activity/Legal Involvement Pertinent to Current Situation/Hospitalization:  No - Comment as needed Significant Relationships:  Adult Children, Other Family Members, Friend Lives with:  Self Do you feel safe going back to the place where you live?  No Need for family participation in patient care:  Yes (Comment)  Care giving concerns:  Pt resides alone. Pt unable to return home given new impairment. Pt and family at bedside and agreeable to SNF. Pt shared that she was independent before hospitalization and desires to return to her independence. CSW validated feelings. Pt has never experienced SNF, but was interested in [facility]. CSW obtained permission to send to SNF's in the [place]. CSW explained SNF placement/process and insurance process.  PT is pending.  Social Worker assessment / plan:  CSW will f/u for disposition.  Employment status:  Retired Database administratornsurance information:  Managed Medicare PT Recommendations:  Skilled Nursing Facility Information / Referral to community resources:  Skilled Nursing Facility  Patient/Family's Response to care:  Patent attorneyatient/family appreciative of CSW assistance with discussing discharge plan. No issues or concerns. Patient/Family agreeable to SNF.  Patient/Family's Understanding  of and Emotional Response to Diagnosis, Current Treatment, and Prognosis:  Patient/family has good understanding of diagnosis/impairment and agreeable to SNF. Pt plan is to return home to independence once her short term rehab is complete. No issues or concerns. Pt has good family support as well and will be supportive when she returns back to community.  Emotional Assessment Appearance:  Appears stated age Attitude/Demeanor/Rapport:  (Cooperative) Affect (typically observed):  Accepting, Appropriate Orientation:  Oriented to Situation, Oriented to  Time, Oriented to Place, Oriented to Self Alcohol / Substance use:  Not Applicable Psych involvement (Current and /or in the community):  No (Comment)  Discharge Needs  Concerns to be addressed:  Discharge Planning Concerns Readmission within the last 30 days:  No Current discharge risk:  Dependent with Mobility, Physical Impairment Barriers to Discharge:  No Barriers Identified   Tresa Mooreatricia V Derryl Uher, LCSW 10/28/2017, 1:31 PM

## 2017-10-28 NOTE — Evaluation (Signed)
Physical Therapy Evaluation Patient Details Name: [name] MRN: [id number] DOB: 07-Nov-1940 Today's Date: 10/28/2017   History of Present Illness  77 yo female s/p fall with ORIF R ankle   Clinical Impression  Pt admitted with above diagnosis. Pt currently with functional limitations due to the deficits listed below (see PT Problem List). At the time of PT eval, patient requiring total assist + 2 with bed mobility and transfers; unable to initiate gait due to patient's inability to achieve an upright standing position and maintain nonweightbearing precautions. Patient needs frequent cueing to adhere to weightbearing precautions. Patient deficits include decreased functional mobility secondary to diminished strength, range of motion, and endurance. Patient also limited by R shoulder pain during mobility. Based on deficits, previous level of function, and decreased caregiver support, recommend SNF in order to maximize functional independence. Pt will benefit from skilled PT to increase their independence and safety with mobility to allow discharge to the venue listed below.       Follow Up Recommendations SNF    Equipment Recommendations  None recommended by PT;Other (comment)(Defer to SNF)    Recommendations for Other Services       Precautions / Restrictions Precautions Precautions: Fall Restrictions Weight Bearing Restrictions: Yes RLE Weight Bearing: Non weight bearing      Mobility  Bed Mobility Overal bed mobility: Needs Assistance Bed Mobility: Sit to Supine       Sit to supine: Total assist;+2 for physical assistance      Transfers Overall transfer level: Needs assistance   Transfers: Sit to/from Stand;Squat Pivot Transfers Sit to Stand: +2 physical assistance;Total assist   Squat pivot transfers: Total assist;+2 physical assistance     General transfer comment: Attempted 2 sit to stands with total assist + 2 but patient unable to achieve clear hips  significantly. VC's provided in order to maintain precautions, hand placement, foot placement, and rocking to gain momentum. Pt performed squat pivot from chair to bed with total assist + 2. Upon return back to bed, patient needed total assist to scoot hips back; pt demonstrating decreased safety awareness with attempting to lean back and required L knee block to prevent from sliding forward.  Ambulation/Gait                Stairs            Wheelchair Mobility    Modified Rankin (Stroke Patients Only)       Balance Overall balance assessment: Needs assistance Sitting-balance support: Bilateral upper extremity supported;Feet supported Sitting balance-Leahy Scale: Poor Sitting balance - Comments: Pt with posterior lean and requires total (A) with pad to scoot back.                                      Pertinent Vitals/Pain Pain Assessment: Faces Faces Pain Scale: Hurts whole lot Pain Location: Bottom Pain Descriptors / Indicators: Aching Pain Intervention(s): Repositioned;Other (comment)(Transferred from chair to bed; RN aware)    Home Living Family/patient expects to be discharged to:: Private residence Living Arrangements: Alone Available Help at Discharge: Friend(s) Type of Home: House Home Access: Ramped entrance     Home Layout: One level Home Equipment: Bedside commode;Walker - 4 wheels;Hand held shower head Additional Comments: reports "i just get whoever comes by the house to help me"  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 Patient unable to perform full R hip flexion in sitting and able to perform full LAQ and dorsiflexion.  LLE Deficits / Details: MMT: Hip flexion 2/5, knee extension 4/5, ankle dorsiflexion 5/5    Cervical / Trunk Assessment Cervical / Trunk Assessment: Other exceptions;Kyphotic(hx of back pain)  Communication   Communication: No difficulties  Cognition Arousal/Alertness: Awake/alert Behavior During Therapy: WFL for tasks assessed/performed Overall Cognitive Status: Within Functional Limits for tasks assessed                                        General Comments      Exercises     Assessment/Plan    PT Assessment Patient needs continued PT services  PT Problem List Decreased strength;Decreased range of motion;Decreased activity tolerance;Decreased balance;Decreased mobility;Decreased safety awareness;Decreased knowledge of precautions       PT Treatment Interventions DME instruction;Gait training;Functional mobility training;Therapeutic activities;Therapeutic exercise;Balance training;Patient/family education    PT Goals (Current goals can be found in the Care Plan section)  Acute Rehab PT Goals Patient Stated Goal: to return to working- i like working PT Goal Formulation: With patient Time For Goal Achievement: 11/11/17 Potential to Achieve Goals: Fair    Frequency Min 3X/week   Barriers to discharge Decreased caregiver support      Co-evaluation               AM-PAC PT "6 Clicks" Daily Activity  Outcome Measure Difficulty turning over in bed (including adjusting bedclothes, sheets and blankets)?: Unable Difficulty moving from lying on back to sitting on the side of the bed? : Unable Difficulty sitting down on and standing up from a chair  with arms (e.g., wheelchair, bedside commode, etc,.)?: Unable Help needed moving to and from a bed to chair (including a wheelchair)?: Total Help needed walking in hospital room?: Total Help needed climbing 3-5 steps with a railing? : Total 6 Click Score: 6    End of Session Equipment Utilized During Treatment: Gait belt Activity Tolerance: Patient limited by fatigue;Patient limited by pain Patient left: in bed;with call bell/phone within reach;with family/visitor present Nurse Communication: Mobility status PT Visit Diagnosis: Muscle weakness (generalized) (M62.81);Other abnormalities of gait and mobility (R26.89);Pain Pain - Right/Left: Right Pain - part of body: Shoulder(Bottom)    Time: 4098-1191 PT Time Calculation (min) (ACUTE ONLY): 25 min   Charges:   PT Evaluation $PT Eval Moderate Complexity: 1 Mod PT Treatments $Therapeutic Activity: 8-22 mins   PT G Codes:        Laurina Bustle, PT, DPT Acute Rehabilitation Services    Vanetta Mulders 10/28/2017, 12:51 PM

## 2017-10-28 NOTE — NC FL2 (Signed)
[place] MEDICAID FL2 LEVEL OF CARE SCREENING TOOL     IDENTIFICATION  Patient Name: [name] Birthdate: 18-Oct-1940 Sex: female Admission Date (Current Location): 10/27/2017  [facility] and [place] Number:  Producer, television/film/video and Address:  [facility] [place]. [facility], [address] [place], [place] [address]      Provider Number: [id number]  Attending Physician Name and Address:  Jodi Geralds, MD  Relative Name and Phone Number:  Randall An, daughter, [phone]    Current Level of Care: Hospital Recommended Level of Care: Skilled Nursing Facility Prior Approval Number:    Date Approved/Denied:   PASRR Number: [id number]  Discharge Plan: SNF    Current Diagnoses: Patient Active Problem List   Diagnosis Date Noted  . Displaced trimalleolar fracture of right ankle 10/27/2017  . BMI 32.0-32.9,adult 04/02/2016  . Skin candidiasis 04/02/2016  . Chest pain   . Atypical chest pain 03/30/2012  . DM (diabetes mellitus), type 2, uncontrolled with complications (HCC) 03/30/2012  . Hypertension   . GERD (gastroesophageal reflux disease)   . Hyperlipidemia   . Arthritis     Orientation RESPIRATION BLADDER Height & Weight     Self, Time, Situation, Place  O2(Nasal Cannula 2L) Continent Weight: 179 lb 14.3 oz (81.6 kg) Height:  5\' 2"  (157.5 cm)  BEHAVIORAL SYMPTOMS/MOOD NEUROLOGICAL BOWEL NUTRITION STATUS      Continent Diet(See DC Summary)  AMBULATORY STATUS COMMUNICATION OF NEEDS Skin   Extensive Assist Verbally Surgical wounds                       Personal Care Assistance Level of Assistance  Bathing, Feeding, Dressing Bathing Assistance: Maximum assistance Feeding assistance: Limited assistance Dressing Assistance: Maximum assistance     Functional Limitations Info  Sight, Hearing, Speech Sight Info: Adequate Hearing Info: Adequate Speech Info: Adequate    SPECIAL CARE FACTORS FREQUENCY  PT (By licensed PT), OT (By licensed OT)      PT Frequency: 5x week OT Frequency: 5x week            Contractures      Additional Factors Info  Code Status, Allergies Code Status Info: Full  Allergies Info: LIRAGLUTIDE, NIACIN, ATORVASTATIN, DARVOCET PROPOXYPHENE N-ACETAMINOPHEN, METFORMIN AND RELATED, PROPOXYPHENE            Current Medications (10/28/2017):  This is the current hospital active medication list Current Facility-Administered Medications  Medication Dose Route Frequency Provider Last Rate Last Dose  . 0.9 %  sodium chloride infusion   Intravenous Continuous Marshia Ly, PA-C 50 mL/hr at 10/28/17 0522 50 mL/hr at 10/28/17 0522  . acetaminophen (TYLENOL) tablet 500 mg  500 mg Oral Q6H Marshia Ly, PA-C   500 mg at 10/28/17 1249  . allopurinol (ZYLOPRIM) tablet 300 mg  300 mg Oral Daily Marshia Ly, PA-C   300 mg at 10/28/17 0908  . amLODipine (NORVASC) tablet 5 mg  5 mg Oral Daily Marshia Ly, PA-C   5 mg at 10/28/17 0908  . [START ON 10/29/2017] aspirin EC tablet 325 mg  325 mg Oral Daily Marshia Ly, PA-C      . benazepril (LOTENSIN) tablet 40 mg  40 mg Oral Daily Marshia Ly, PA-C   40 mg at 10/28/17 0908  . bisacodyl (DULCOLAX) EC tablet 5 mg  5 mg Oral Daily PRN Marshia Ly, PA-C      . ceFAZolin (ANCEF) IVPB 2g/100 mL premix  2 g Intravenous Q6H Marshia Ly, PA-C  Stopped at 10/28/17 1000  . docusate sodium (COLACE) capsule 100 mg  100 mg Oral BID Marshia LyBethune, James, PA-C   100 mg at 10/28/17 [id number]  . fenofibrate tablet 160 mg  160 mg Oral Daily Marshia LyBethune, James, PA-C   160 mg at 10/28/17 0908  . furosemide (LASIX) tablet 30 mg  30 mg Oral Daily Marshia LyBethune, James, PA-C   30 mg at 10/28/17 [id number]  . glimepiride (AMARYL) tablet 8 mg  8 mg Oral Q breakfast Marshia LyBethune, James, PA-C   8 mg at 10/28/17 [id number]  . HYDROcodone-acetaminophen (NORCO/VICODIN) 5-325 MG per tablet 1-2 tablet  1-2 tablet Oral Q4H PRN Marshia LyBethune, James, PA-C      . insulin aspart (novoLOG) injection 0-15 Units  0-15 Units  Subcutaneous TID WC Marshia LyBethune, James, PA-C   3 Units at 10/28/17 1249  . insulin glargine (LANTUS) injection 20 Units  20 Units Subcutaneous Q2200 Marshia LyBethune, James, PA-C      . levothyroxine (SYNTHROID, LEVOTHROID) tablet 75 mcg  75 mcg Oral QAC breakfast Marshia LyBethune, James, PA-C   75 mcg at 10/28/17 [id number]  . magnesium citrate solution 1 Bottle  1 Bottle Oral Once PRN Marshia LyBethune, James, PA-C      . methocarbamol (ROBAXIN) tablet 500 mg  500 mg Oral Q6H PRN Marshia LyBethune, James, PA-C   500 mg at 10/28/17 0908   Or  . methocarbamol (ROBAXIN) 500 mg in dextrose 5 % 50 mL IVPB  500 mg Intravenous Q6H PRN Marshia LyBethune, James, PA-C      . metoprolol succinate (TOPROL-XL) 24 hr tablet 150 mg  150 mg Oral Daily Marshia LyBethune, James, PA-C   150 mg at 10/28/17 [id number]  . morphine 2 MG/ML injection 0.5-1 mg  0.5-1 mg Intravenous Q2H PRN Marshia LyBethune, James, PA-C      . nitroGLYCERIN (NITROSTAT) SL tablet 0.4 mg  0.4 mg Sublingual Q5 min PRN Marshia LyBethune, James, PA-C      . ondansetron [facility](ZOFRAN) tablet 4 mg  4 mg Oral Q6H PRN Marshia LyBethune, James, PA-C       Or  . ondansetron [facility](ZOFRAN) injection 4 mg  4 mg Intravenous Q6H PRN Marshia LyBethune, James, PA-C   4 mg at 10/28/17 1113  . pantoprazole (PROTONIX) EC tablet 40 mg  40 mg Oral Daily Marshia LyBethune, James, PA-C   40 mg at 10/28/17 0908  . polyethylene glycol (MIRALAX / GLYCOLAX) packet 17 g  17 g Oral Daily PRN Marshia LyBethune, James, PA-C      . pravastatin (PRAVACHOL) tablet 80 mg  80 mg Oral q1800 Marshia LyBethune, James, PA-C         Discharge Medications: Please see discharge summary for a list of discharge medications.  Relevant Imaging Results:  Relevant Lab Results:   Additional Information SS#: [id number]  Tresa MoorePatricia V Miamor Ayler, LCSW

## 2017-10-28 NOTE — Anesthesia Postprocedure Evaluation (Signed)
Anesthesia Post Note  Patient: Nicole CrutchLucille D Kofman  Procedure(s) Performed: OPEN REDUCTION INTERNAL FIXATION (ORIF) ANKLE FRACTURE (Right Leg Lower)     Patient location during evaluation: PACU Anesthesia Type: General Level of consciousness: awake Pain management: pain level controlled Vital Signs Assessment: post-procedure vital signs reviewed and stable Respiratory status: spontaneous breathing Cardiovascular status: stable Anesthetic complications: no    Last Vitals:  Vitals:   10/28/17 0015 10/28/17 0030  BP: (!) 149/62 (!) 156/73  Pulse: (!) 112 (!) 105  Resp: 17 14  Temp:  (P) 36.6 C  SpO2: 97% 100%    Last Pain:  Vitals:   10/27/17 1936  TempSrc:   PainSc: 5                  Tamanna Whitson

## 2017-10-28 NOTE — Evaluation (Addendum)
Occupational Therapy Evaluation Patient Details Name: [name] MRN: [id number] DOB: 09/29/1940 Today's Date: 10/28/2017    History of Present Illness 77 yo female s/p fall with ORIF R ankle  pmh: Past Medical History:  Diagnosis Date  . Arthritis   . Chest pain    Hospital 03/2012  // outpatient nuclear normal 04/02/2012  EF  80%  . Diabetes mellitus   . GERD (gastroesophageal reflux disease)   . Hyperlipidemia   . Hypertension   . Obesity   . Stress at home       Clinical Impression   Patient is s/p ORIF R ankle surgery resulting in functional limitations due to the deficits listed below (see OT problem list). Pta was independent with all care, driving and grocery store per patient. But patient also self reports being on 3n1 for 2.5 hours at home prior to fall due to back pain and inability to get off alone. Pt reports church members "stopping by" as her source of help. Pt works cleaning houses but reports "the lady called me and said she don't need me to help for a while now." Pt noted to have strong odor to urine and question possible UTI (?). Just want to make note of this since patient toileted during OT session. Patient will benefit from skilled OT acutely to increase independence and safety with ADLS to allow discharge SNF. Pt lacks awareness to deficits and unable to maintain NWB this session. Advise that staff complete all transfers two person (A).    **Addendum- also of noted- pt very concerned with giving EMS staff her social security information for transport. OT alerting CM/SNF staff of this concern.    Follow Up Recommendations  SNF    Equipment Recommendations  (defer to SNF )    Recommendations for Other Services       Precautions / Restrictions Precautions Precautions: Fall Restrictions Weight Bearing Restrictions: Yes RLE Weight Bearing: Non weight bearing      Mobility Bed Mobility Overal bed mobility: Needs Assistance Bed Mobility: Supine to  Sit     Supine to sit: Max assist     General bed mobility comments: pt c/o R arm pain. pt noted to have bruising at clavicle on R side. Pt reports pain to palation. Pt with pain at scapula tactile input as well.   Transfers Overall transfer level: Needs assistance Equipment used: 2 person hand held assist Transfers: Sit to/from [employer] CorporationStand;Stand Pivot Transfers Sit to Stand: +2 physical assistance;+2 safety/equipment;Max assist Stand pivot transfers: Max assist;+2 physical assistance;+2 safety/equipment;From elevated surface       General transfer comment: pt transfered bed to toilet +1 max (A) with out RW. pt unable to use RW correctly,. pt pushing RW away and when asked states "i am not sure why i keep moving it" pt pulling during session when she should push. Pt inconsistent in following commands. recommend +2 for all transfers for safety of staff and patient. pt unable to hope or use bil UE to transfer. pt lacks insight to deficits [facility].     Balance Overall balance assessment: Needs assistance Sitting-balance support: Bilateral upper extremity supported;Feet supported Sitting balance-Leahy Scale: Poor Sitting balance - Comments: pt with posterior lean initially and requires total (A) with pad to scoot to eob and L UE support on rail to become min guard   Standing balance support: Bilateral upper extremity supported;During functional activity Standing balance-Leahy Scale: Poor  ADL either performed or assessed with clinical judgement   ADL Overall ADL's : Needs assistance/impaired Eating/Feeding: Minimal assistance   Grooming: Min guard Grooming Details (indicate cue type and reason): pt requires seated position Upper Body Bathing: Moderate assistance   Lower Body Bathing: Total assistance   Upper Body Dressing : Moderate assistance   Lower Body Dressing: Maximal assistance   Toilet Transfer: Total assistance;+2 for physical  assistance;+2 for safety/equipment;BSC Toilet Transfer Details (indicate cue type and reason): pt unable to step so rather pivots on ankle L LE. pt with poor return demo of R LE NWB Toileting- Clothing Manipulation and Hygiene: Total assistance Toileting - Clothing Manipulation Details (indicate cue type and reason): pt requires total (A) for hygiene. pt noted to have strong odor to urine . questions UTI ???       General ADL Comments: pt lives alone and reports inconsistent (A) from church members at home     Vision Baseline Vision/History: Wears glasses Wears Glasses: Reading only       Perception     Praxis      Pertinent Vitals/Pain Pain Assessment: 0-10 Pain Score: 5  Pain Location: R ankle Pain Descriptors / Indicators: Aching Pain Intervention(s): Monitored during session;Premedicated before session;Repositioned     Hand Dominance Right   Extremity/Trunk Assessment Upper Extremity Assessment Upper Extremity Assessment: RUE deficits/detail RUE Deficits / Details: reports "two plates in this shoulder" pt able to shoulder flexion ~40 degrees and abduction 45 degrees   Lower Extremity Assessment Lower Extremity Assessment: Defer to PT evaluation   Cervical / Trunk Assessment Cervical / Trunk Assessment: Other exceptions(hx of back pain) Cervical / Trunk Exceptions: pt reports sitting on the commode for 2.5 hours due to inability to get off due to back pain. But does not seem alerted by the event and sitting there for extended time alone without help    Communication Communication Communication: No difficulties   Cognition Arousal/Alertness: Awake/alert Behavior During Therapy: WFL for tasks assessed/performed Overall Cognitive Status: Within Functional Limits for tasks assessed                                     General Comments  incr risk for skin break down, urine noted to have odor question UTI ??    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 Tuesdays plays Bingo      Prior Functioning/Environment Level of Independence: Independent with assistive device(s)                 OT Problem List: Decreased activity tolerance;Decreased cognition;Decreased knowledge of precautions;Decreased knowledge of use of DME or AE;Decreased safety awareness;Decreased strength;Pain;Obesity;Impaired balance (sitting and/or standing);Impaired UE functional use      OT Treatment/Interventions: Self-care/ADL training;Therapeutic activities;Therapeutic exercise;Balance training;Patient/family education;DME and/or AE instruction;Cognitive remediation/compensation;Energy conservation    OT Goals(Current goals can be found in the care plan section) Acute Rehab OT Goals Patient  Stated Goal: to return to working- i like working OT Goal Formulation: With patient Time For Goal Achievement: 11/11/17 Potential to Achieve Goals: Good  OT Frequency: Min 2X/week   Barriers to D/C: Decreased caregiver support  lives alone       Co-evaluation              AM-PAC PT "6 Clicks" Daily Activity     Outcome Measure Help from another person eating meals?: A Little Help from another person taking care of personal grooming?: Total Help from another person toileting, which includes using toliet, bedpan, or urinal?: Total Help from another person bathing (including washing, rinsing, drying)?: Total Help from another person to put on and taking off regular upper  body clothing?: Total Help from another person to put on and taking off regular lower body clothing?: Total 6 Click Score: 8   End of Session Equipment Utilized During Treatment: Rolling walker;Gait belt Nurse Communication: Mobility status;Precautions;Weight bearing status  Activity Tolerance: Patient tolerated treatment well Patient left: in chair;with call bell/phone within reach;with nursing/sitter in room  OT Visit Diagnosis: Unsteadiness on feet (R26.81);Muscle weakness (generalized) (M62.81)                Time: 1610-9604 OT Time Calculation (min): 39 min Charges:  OT General Charges $OT Visit: 1 Visit OT Evaluation $OT Eval Moderate Complexity: 1 Mod OT Treatments $Self Care/Home Management : 23-37 mins G-Codes:      Mateo Flow   OTR/L Pager: [phone] Office: [phone] .   Boone Master B 10/28/2017, 11:05 AM

## 2017-10-28 NOTE — Op Note (Signed)
NAMELANEICE, [name]:  [id number]  MEDICAL RECORD NO.:  [id number]  LOCATION:                                 FACILITY:  PHYSICIAN:  Harvie Junior, M.D.   DATE OF BIRTH:  Apr 29, 1941  DATE OF PROCEDURE:  10/27/2017 DATE OF DISCHARGE:                              OPERATIVE REPORT   PREOPERATIVE DIAGNOSIS:  Trimalleolar fracture dislocation with inability to reduce in the emergency room.  POSTOPERATIVE DIAGNOSIS:  Trimalleolar fracture dislocation with inability to reduce in the emergency room.  PROCEDURES: 1. Open reduction and internal fixation of trimalleolar ankle fracture     with lateral fixation with 2 interfragmentary screws and one-third     tubular plate, 8-hole, medial fixation with 2 cannulated screws in     parallel, and posterior malleolus fixation with a single anterior     to posterior cannulated screw. 2. Interpretation of multiple intraoperative fluoroscopic images.  SURGEON:  Harvie Junior, MD.  ASSISTANT:  Marshia Ly, PA.  ANESTHESIA:  General.  BRIEF HISTORY:  [name] is a 77 year old female with long history and significant complaints of falling.  She suffered a trimalleolar ankle fracture dislocation.  She had multiple attempts at reduction in the emergency room and they were unsuccessful.  After failure of all these attempts, we were consulted and she was ultimately taken to the operating room for open reduction and internal fixation.  DESCRIPTION OF PROCEDURE:  The patient was taken to the operating room and after adequately anaesthesia with general anaesthetic, the patient was placed supine on the operating table.  The right leg was then prepped and draped in usual sterile fashion.  Following this, the leg was exsanguinated.  Blood pressure tourniquet inflated to 300 mmHg. Following this, an incision was made along the lateral side. Subcutaneous tissue was dissected down the level of the fracture.  The fracture  was opened, cleansed of healing elements, irrigated, and anatomically reduced.  Two interfragmentary screws back to front were placed followed by 8-hole one-third tubular plate, excellent fixation throughout.  Anatomic reduction was achieved.  Attention was turned medially where an incision was made.  The medial malleolus was reduced. There appeared to be some issues with reduction and what had happened was just above the fracture piece, there was comminution of the cortex, but the cancellous portion was intact and the joint line was absolute anatomically reduced.  We put K-wires across there and then put 2 cannulated screws to get anatomic fixation.  At this point, attention was turned towards the posterior malleolus which was anatomically reduced, but we felt that we could get a single screw and this was a large piece, almost 25% of the joint.  A single front to back cannulated screw was placed under direct fluoroscopic visualization and got excellent fixation and compression.  Once this was done, the wounds were irrigated, suctioned dry, closed in layers.  A sterile compressive dressing was applied as well as the U and a posterior splint.  The patient was taken to the recovery room where she was noted to be in satisfactory condition. Estimated blood loss for the procedure was minimal.     Harvie Junior, M.D.  JLG/MEDQ  D:  10/27/2017  T:  10/28/2017  Job:  [id number]

## 2017-10-29 ENCOUNTER — Encounter (HOSPITAL_COMMUNITY): Payer: Self-pay | Admitting: General Practice

## 2017-10-29 LAB — GLUCOSE, CAPILLARY
GLUCOSE-CAPILLARY: 136 mg/dL — AB (ref 65–99)
Glucose-Capillary: 143 mg/dL — ABNORMAL HIGH (ref 65–99)
Glucose-Capillary: 156 mg/dL — ABNORMAL HIGH (ref 65–99)
Glucose-Capillary: 154 mg/dL — ABNORMAL HIGH (ref 65–99)

## 2017-10-29 NOTE — Plan of Care (Signed)
  Education: Knowledge of General Education information will improve 10/29/2017 0314 - Progressing by Olena Materobinson, Leniya Breit G, RN Note POC reviewed with pt.

## 2017-10-29 NOTE — Progress Notes (Addendum)
Subjective: 2 Days Post-Op Procedure(s) (LRB): OPEN REDUCTION INTERNAL FIXATION (ORIF) ANKLE FRACTURE (Right) Patient reports pain as mild..  Taking by mouth and voiding okay.  She reports that she does have family in town and is interested in going home with home health physical therapy.  Objective: Vital signs in last 24 hours: Temp:  [97.7 F (36.5 C)-98.6 F (37 C)] 98.6 F (37 C) (03/12 0551) Pulse Rate:  [67-98] 67 (03/12 0551) Resp:  [16] 16 (03/12 0551) BP: (147-151)/(50-51) 150/50 (03/12 0551) SpO2:  [95 %-97 %] 95 % (03/12 0551)  Intake/Output from previous day: 03/11 0701 - 03/12 0700 In: 820 [P.O.:720; IV Piggyback:100] Out: 500 [Urine:500] Intake/Output this shift: Total I/O In: 240 [P.O.:240] Out: -   Recent Labs    10/27/17 1316  HGB 13.3   Recent Labs    10/27/17 1316  WBC 16.6*  RBC 4.41  HCT 42.4  PLT 286   Recent Labs    10/27/17 1316 10/28/17 0531  NA 137 136  K 3.8 4.9  CL 102 101  CO2 22 25  BUN 32* 27*  CREATININE 0.97 0.85  GLUCOSE 208* 158*  CALCIUM 8.8* 8.4*   No results for input(s): LABPT, INR in the last 72 hours. Right lower extremity exam: Posterior/U-splint intact.  Moves toes actively.  Normal sensation in toes.  Minimal swelling in toes.  Her dressing/splint is clean and dry.   Assessment/Plan: 2 Days Post-Op Procedure(s) (LRB): OPEN REDUCTION INTERNAL FIXATION (ORIF) ANKLE FRACTURE (Right)  Diabetes mellitus.  Insulin-dependent.  On sliding scale insulin. Plan: Since the patient does have a support system she would like to consider going home with home health physical therapy. Continue nonweightbearing on the right lower extremity. Aspirin 1 daily for DVT prophylaxis.  SCD on opposite leg. I will order a case manager consult to move forward. Hopefully can discharge to home tomorrow.  We will see how she progresses with physical therapy today. Continue sliding-scale insulin in the meantime.  Sena Hoopingarner  G 10/29/2017, 8:12 AM

## 2017-10-29 NOTE — Social Work (Addendum)
CSW met with patient at bedside to discuss SNF placement. CSW validated concerns and explored the lack of support at home. Pt has now agreed to SNF placement.  CSW provided patient a list of SNF offers to discuss with daughter. CSW explained to patient that SNF will need to obtain Insurance Auth prior to discharge. Pt acknowledges and CSW will continue to follow up.  12:45pm: CSW discussed the SNF offers again with family. Family has accepted SNF offer from [facility] and [place].  CSW discussed with patient/familly medicaid to assist with covering co-pays and they can contact medicare as well.  CSW provided address of local social service department so that family can f/u to complete medicaid application and CSW provided online web address to apply on line for medicaid. CSW answered all questions and provided resources to assist with needs in community.  CSW then confirmed again bed offer with admission staff at [facility] and [place]. SNF will start Insurance auth for SNF placement.  Elissa Hefty, LCSW Clinical Social Worker [phone]

## 2017-10-30 LAB — GLUCOSE, CAPILLARY
Glucose-Capillary: 138 mg/dL — ABNORMAL HIGH (ref 65–99)
Glucose-Capillary: 168 mg/dL — ABNORMAL HIGH (ref 65–99)
Glucose-Capillary: 120 mg/dL — ABNORMAL HIGH (ref 65–99)
Glucose-Capillary: 163 mg/dL — ABNORMAL HIGH (ref 65–99)

## 2017-10-30 NOTE — Social Work (Signed)
CSW contacted SNF to see if they have received Insurance Auth yet. SNF advised that they are f/u as they have not received Insurance Auth [facility].  CSW will continue to follow up.  Keene BreathPatricia Aven Cegielski, LCSW Clinical Social Worker [phone]

## 2017-10-30 NOTE — Progress Notes (Signed)
Occupational Therapy Treatment Patient Details Name: Nicole CrutchLucille D Peil MRN: [id number] DOB: 1941-02-18 Today's Date: 10/30/2017    History of present illness 77 yo female s/p fall with ORIF R ankle   Past Medical History:  Diagnosis Date  . Arthritis   . Chest pain    Hospital 03/2012  // outpatient nuclear normal 04/02/2012  EF  80%  . Diabetes mellitus   . GERD (gastroesophageal reflux disease)   . Hyperlipidemia   . Hypertension   . Obesity   . Stress at home   ]    OT comments  Pt remains total +2 (A) for all transfers and max (A) for lb adls. Pt able to maintain NWB on R LE this session. Pt very pleasant but requires (A) for all adls. Pt agreeable to SNF at this time. Pt reports "I am going to do again!"    Follow Up Recommendations  SNF    Equipment Recommendations       Recommendations for Other Services      Precautions / Restrictions Precautions Precautions: Fall Restrictions Weight Bearing Restrictions: Yes RLE Weight Bearing: Non weight bearing       Mobility Bed Mobility Overal bed mobility: Needs Assistance Bed Mobility: Sit to Supine       Sit to supine: +2 for physical assistance;Max assist   General bed mobility comments: Patient able to scoot LE's minimally towards edge of bed and required maxA + 2 for BLE and trunk management and scooting hips towards edge of bed.    Transfers Overall transfer level: Needs assistance   Transfers: Sit to/from Stand;Stand Pivot Transfers Sit to Stand: +2 physical assistance;Max assist   Squat pivot transfers: Total assist;+2 physical assistance     General transfer comment: 2 sit to stands with max assist + 2. Patient instructed on hand placement and to rock/lean forward to gain momentum. Stand pivot from bed to chair with max assist + 2. Patient able to move left foot minimally. Pt able to maintain precautions through transfers.  pt with inability to follow instructions with RW in front so transfer  completed with hand held (A) +2 instead    Balance Overall balance assessment: Needs assistance Sitting-balance support: Bilateral upper extremity supported;Feet supported Sitting balance-Leahy Scale: Poor Sitting balance - Comments: Pt with posterior lean and requires total (A) with pad to scoot back.    Standing balance support: Bilateral upper extremity supported Standing balance-Leahy Scale: Poor Standing balance comment: Bilateral upper extremity support required                           ADL either performed or assessed with clinical judgement   ADL Overall ADL's : Needs assistance/impaired Eating/Feeding: Modified independent   Grooming: Wash/dry hands;Wash/dry face;Oral care;Set up       Lower Body Bathing: Maximal assistance       Lower Body Dressing: Total assistance   Toilet Transfer: +2 for physical assistance;Stand-pivot;Moderate assistance Toilet Transfer Details (indicate cue type and reason): pt unable to turn without therapist directing the transfer with weight shift but does maintain nwb. pt incontinent on arrival and unaware due to purewick use           General ADL Comments: pt remains dependent on total +2 (A) for all transfers and max (A) for LB adls     Vision       Perception     Praxis      Cognition Arousal/Alertness: Awake/alert Behavior During  Therapy: WFL for tasks assessed/performed Overall Cognitive Status: Within Functional Limits for tasks assessed                                          Exercises     Shoulder Instructions       General Comments      Pertinent Vitals/ Pain       Pain Assessment: Faces Faces Pain Scale: Hurts little more Pain Location: R foot Pain Descriptors / Indicators: Aching Pain Intervention(s): Monitored during session;Premedicated before session;Repositioned  Home Living                                          Prior Functioning/Environment               Frequency  Min 2X/week        Progress Toward Goals  OT Goals(current goals can now be found in the care plan section)  Progress towards OT goals: Progressing toward goals  Acute Rehab OT Goals Patient Stated Goal: to return to working- i like working OT Goal Formulation: With patient Time For Goal Achievement: 11/11/17 Potential to Achieve Goals: Good ADL Goals Pt Will Transfer to Toilet: with max assist;bedside commode;stand pivot transfer Additional ADL Goal #1: pt will complete bed mobility supervision level as precursor to adl.s Additional ADL Goal #2: pt will sit eob unsupported supervision level as precursor to adls.  Plan Discharge plan remains appropriate    Co-evaluation    PT/OT/SLP Co-Evaluation/Treatment: Yes Reason for Co-Treatment: Necessary to address cognition/behavior during functional activity;For patient/therapist safety;To address functional/ADL transfers PT goals addressed during session: Mobility/safety with mobility OT goals addressed during session: ADL's and self-care;Proper use of Adaptive equipment and DME;Strengthening/ROM      AM-PAC PT "6 Clicks" Daily Activity     Outcome Measure   Help from another person eating meals?: A Little Help from another person taking care of personal grooming?: A Lot Help from another person toileting, which includes using toliet, bedpan, or urinal?: Total Help from another person bathing (including washing, rinsing, drying)?: A Lot Help from another person to put on and taking off regular upper body clothing?: Total Help from another person to put on and taking off regular lower body clothing?: Total 6 Click Score: 10    End of Session Equipment Utilized During Treatment: Gait belt;Rolling walker  OT Visit Diagnosis: Unsteadiness on feet (R26.81);Muscle weakness (generalized) (M62.81)   Activity Tolerance Patient tolerated treatment well   Patient Left in chair;with call bell/phone within  reach;with chair alarm set;with nursing/sitter in room   Nurse Communication Mobility status;Need for lift equipment;Precautions;Weight bearing status        Time: 0947(947)-1013 OT Time Calculation (min): 26 min  Charges: OT General Charges $OT Visit: 1 Visit OT Treatments $Self Care/Home Management : 8-22 mins   Mateo Flow   OTR/L Pager: [phone] Office: [phone] .    Boone Master B 10/30/2017, 3:17 PM

## 2017-10-30 NOTE — Social Work (Signed)
CSW f/u with SNF and they have not received Insurance Auth yet for patient to admit to [facility] and [place].  CSW met with patient at bedside and advised of same.  CSW will continue to follow up for disposition.  Elissa Hefty, LCSW Clinical Social Worker [phone]

## 2017-10-30 NOTE — Progress Notes (Signed)
Subjective: 3 Days Post-Op Procedure(s) (LRB): OPEN REDUCTION INTERNAL FIXATION (ORIF) ANKLE FRACTURE (Right) Patient reports pain as mild.  The patient lives alone and has made slow progress with physical therapy.  Objective: Vital signs in last 24 hours: Temp:  [97.6 F (36.4 C)-98 F (36.7 C)] 97.6 F (36.4 C) (03/13 0442) Pulse Rate:  [64-71] 71 (03/13 0442) Resp:  [16-19] 16 (03/12 2005) BP: (96-135)/(44-60) 135/44 (03/13 0442) SpO2:  [94 %-99 %] 97 % (03/13 0442)  Intake/Output from previous day: 03/12 0701 - 03/13 0700 In: 2842.8 [P.O.:480; I.V.:2362.8] Out: 75 [Urine:75] Intake/Output this shift: No intake/output data recorded.  Recent Labs    10/27/17 1316  HGB 13.3   Recent Labs    10/27/17 1316  WBC 16.6*  RBC 4.41  HCT 42.4  PLT 286   Recent Labs    10/27/17 1316 10/28/17 0531  NA 137 136  K 3.8 4.9  CL 102 101  CO2 22 25  BUN 32* 27*  CREATININE 0.97 0.85  GLUCOSE 208* 158*  CALCIUM 8.8* 8.4*   No results for input(s): LABPT, INR in the last 72 hours. Right lower extremity exam: Posterior/U-splint intact.  Moves toes actively.  Normal sensation in toes.  Minimal if any swelling in the toes.   Assessment/Plan: 3 Days Post-Op Procedure(s) (LRB): OPEN REDUCTION INTERNAL FIXATION (ORIF) ANKLE FRACTURE (Right)  Plan: Discharge to skilled nursing facility today. Nonweightbearing on right lower extremity. Elevate right leg is much as possible. Follow-up with Dr. Luiz BlareGraves in 2 weeks.  Elisia Stepp G 10/30/2017, 10:10 AM

## 2017-10-30 NOTE — Progress Notes (Signed)
Physical Therapy Treatment Patient Details Name: [name] MRN: [id number] DOB: [birth year] Today's Date: 10/30/2017    History of Present Illness 77 yo female s/p fall with ORIF R ankle     PT Comments    Patient progressing very slowly with therapy. Requiring max assist + 2 with bed mobility, sit to stand transfers, and stand pivot transfers. However, patient did well with maintaining nonweightbearing precautions today. 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 Additionally, patient with continued report of R shoulder pain and is tender to palpation on acromion. No acute findings noted on xray.     Follow Up Recommendations  SNF     Equipment Recommendations  None recommended by PT;Other (comment)(Defer to SNF)    Recommendations for Other Services       Precautions / Restrictions Precautions Precautions: Fall Restrictions Weight Bearing Restrictions: Yes RLE Weight Bearing: Non weight bearing    Mobility  Bed Mobility Overal bed mobility: Needs Assistance Bed Mobility: Sit to Supine       Sit to supine: +2 for physical assistance;Max assist   General bed mobility comments: Patient able to scoot LE's minimally towards edge of bed and required maxA + 2 for BLE and trunk management and scooting hips towards edge of bed.    Transfers Overall transfer level: Needs assistance   Transfers: Sit to/from Stand;Stand Pivot Transfers Sit to Stand: +2 physical assistance;Max assist   Squat pivot transfers: Total assist;+2 physical assistance     General transfer comment: 2 sit to stands with max assist + 2. Patient instructed on hand placement and to rock/lean forward to gain momentum. Stand pivot from bed to chair with max assist + 2. Patient able to move left foot minimally. Pt able to maintain precautions through transfers.   Ambulation/Gait                 Stairs            Wheelchair  Mobility    Modified Rankin (Stroke Patients Only)       Balance Overall balance assessment: Needs assistance Sitting-balance support: Bilateral upper extremity supported;Feet supported Sitting balance-Leahy Scale: Poor Sitting balance - Comments: Pt with posterior lean and requires total (A) with pad to scoot back.    Standing balance support: Bilateral upper extremity supported Standing balance-Leahy Scale: Poor Standing balance comment: Bilateral upper extremity support required                            Cognition Arousal/Alertness: Awake/alert Behavior During Therapy: WFL for tasks assessed/performed Overall Cognitive Status: Within Functional Limits for tasks assessed                                        Exercises      General Comments        Pertinent Vitals/Pain Pain Assessment: Faces Faces Pain Scale: Hurts little more Pain Location: R foot Pain Intervention(s): Limited activity within patient's tolerance;Monitored during session    Home Living                      Prior Function            PT Goals (current goals can now be found in the care plan section) Acute Rehab PT Goals Patient Stated Goal:  to return to working- i like working PT Goal Formulation: With patient Time For Goal Achievement: 11/11/17 Potential to Achieve Goals: Fair Progress towards PT goals: Progressing toward goals    Frequency    Min 3X/week      PT Plan Current plan remains appropriate    Co-evaluation PT/OT/SLP Co-Evaluation/Treatment: Yes Reason for Co-Treatment: For patient/therapist safety;To address functional/ADL transfers PT goals addressed during session: Mobility/safety with mobility        AM-PAC PT "6 Clicks" Daily Activity  Outcome Measure  Difficulty turning over in bed (including adjusting bedclothes, sheets and blankets)?: Unable Difficulty moving from lying on back to sitting on the side of the bed? :  Unable Difficulty sitting down on and standing up from a chair with arms (e.g., wheelchair, bedside commode, etc,.)?: Unable Help needed moving to and from a bed to chair (including a wheelchair)?: A Lot Help needed walking in hospital room?: Total Help needed climbing 3-5 steps with a railing? : Total 6 Click Score: 7    End of Session Equipment Utilized During Treatment: Gait belt Activity Tolerance: Patient tolerated treatment well Patient left: with call bell/phone within reach;in chair Nurse Communication: Mobility status PT Visit Diagnosis: Muscle weakness (generalized) (M62.81);Other abnormalities of gait and mobility (R26.89);Pain Pain - Right/Left: Right Pain - part of body: Ankle and joints of foot(Bottom)     Time: [id number] PT Time Calculation (min) (ACUTE ONLY): 28 min  Charges:  $Therapeutic Activity: 8-22 mins                    G Codes:      Laurina Bustlearoline Robena Ewy, PT, DPT Acute Rehabilitation Services     Vanetta MuldersCarloine H Aleni Andrus 10/30/2017, 2:08 PM

## 2017-10-31 LAB — GLUCOSE, CAPILLARY: GLUCOSE-CAPILLARY: 126 mg/dL — AB (ref 65–99)

## 2017-10-31 NOTE — Clinical Social Work Placement (Signed)
   CLINICAL SOCIAL WORK PLACEMENT  NOTE  Date:  10/31/2017  Patient Details  Name: Nicole CrutchLucille D Pelissier MRN: [id number] Date of Birth: [birth year]  Clinical Social Work is seeking post-discharge placement for this patient at the Skilled  Nursing Facility level of care (*CSW will initial, date and re-position this form in  chart as items are completed):  Yes   Patient/family provided with [place] Clinical Social Work Department's list of facilities offering this level of care within the geographic area requested by the patient (or if unable, by the patient's family).  Yes   Patient/family informed of their freedom to choose among providers that offer the needed level of care, that participate in Medicare, Medicaid or managed care program needed by the patient, have an available bed and are willing to accept the patient.  Yes   Patient/family informed of [facility]'s ownership interest in [facility] and [facility], as well as of the fact that they are under no obligation to receive care at these facilities.  PASRR submitted to EDS on       PASRR number received on 10/28/17     Existing PASRR number confirmed on       FL2 transmitted to all facilities in geographic area requested by pt/family on 10/28/17     FL2 transmitted to all facilities within larger geographic area on       Patient informed that his/her managed care company has contracts with or will negotiate with certain facilities, including the following:        Yes   Patient/family informed of bed offers received.  [facility]     Physician recommends and patient chooses bed at      [facility].  Patient to be transferred to facility by [facility]     Patient family notified on 10/31/17 of transfer.  Name of family member notified:  spoke with daughter Eunice BlaseDebbie     PHYSICIAN       Additional Comment:     _______________________________________________ Tresa MoorePatricia V Nimrod Wendt, LCSW 10/31/2017, 10:13 AM

## 2017-10-31 NOTE — Progress Notes (Signed)
Report to [facility] attempted, no answer. Will try again.

## 2017-10-31 NOTE — Progress Notes (Signed)
Subjective: 4 Days Post-Op Procedure(s) (LRB): OPEN REDUCTION INTERNAL FIXATION (ORIF) ANKLE FRACTURE (Right) Patient reports pain as mild.  Taking by mouth and voiding okay.  Awaiting insurance approval for skilled nursing facility.  Still making slow progress with physical therapy.  Objective: Vital signs in last 24 hours: Temp:  [97.9 F (36.6 C)-98.3 F (36.8 C)] 98.3 F (36.8 C) (03/14 0534) Pulse Rate:  [70-73] 70 (03/14 0534) Resp:  [16] 16 (03/14 0534) BP: (113-141)/(48-55) 141/55 (03/14 0534) SpO2:  [97 %-98 %] 97 % (03/14 0534)  Intake/Output from previous day: 03/13 0701 - 03/14 0700 In: 177 [I.V.:177] Out: 1700 [Urine:1700] Intake/Output this shift: No intake/output data recorded.  No results for input(s): HGB in the last 72 hours. No results for input(s): WBC, RBC, HCT, PLT in the last 72 hours. No results for input(s): NA, K, CL, CO2, BUN, CREATININE, GLUCOSE, CALCIUM in the last 72 hours. No results for input(s): LABPT, INR in the last 72 hours. Right lower extremity exam: Splint intact to right lower extremity.  Moves toes actively.  Normal sensation in toes. Patient is alert and oriented.   Assessment/Plan: 4 Days Post-Op Procedure(s) (LRB): OPEN REDUCTION INTERNAL FIXATION (ORIF) ANKLE FRACTURE (Right)  Plan: Discharge to skilled nursing facility today. Ambulate nonweightbearing on right lower extremity. Follow-up with Dr. Luiz BlareGraves in 2 weeks.   [name] 10/31/2017, 8:17 AM

## 2017-10-31 NOTE — Social Work (Signed)
Clinical Social Worker facilitated patient discharge including contacting patient family and facility to confirm patient discharge plans.  Clinical information faxed to facility and family agreeable with plan.    CSW arranged ambulance transport via [facility].    RN to call [phone] to give report prior to discharge.  Pt going to Room 1202P.  Clinical Social Worker will sign off for now as social work intervention is no longer needed. Please consult us again if new need arises.  Keene BreathPatricia Shakeerah Gradel, LCSW Clinical Social Worker [phone]

## 2017-10-31 NOTE — Progress Notes (Signed)
Report called to Daniel at [facility].  

## 2017-10-31 NOTE — Progress Notes (Signed)
Patient discharged to [facility] via transporters/EMS. All belongings sent with patient. Paperwork given to transporters. VSS. BP 135/90 (BP Location: Right Arm)   Pulse 84   Temp 98.3 F (36.8 C) (Oral)   Resp 16   Ht 5\' 2"  (1.575 m)   Wt 81.6 kg (179 lb 14.3 oz)   SpO2 96%   BMI 32.90 kg/m

## 2017-10-31 NOTE — Care Management Note (Signed)
Case Management Note  Patient Details  Name: [name] MRN: [id number] Date of Birth: June 14, 1941  Subjective/Objective:                 Patient to DC to SNF, please see CSW notes for further DC planning information.    Action/Plan:   Expected Discharge Date:  10/30/17               Expected Discharge Plan:  Skilled Nursing Facility  In-House Referral:  Clinical Social Work  Discharge planning Services  CM Consult  Post Acute Care Choice:    Choice offered to:     DME Arranged:    DME Agency:     HH Arranged:    HH Agency:     Status of Service:  Completed, signed off  If discussed at [employer] Meetings, dates discussed:    Additional Comments:  Lawerance SabalDebbie Jamarkus Lisbon, RN 10/31/2017, 9:51 AM

## 2017-10-31 NOTE — Discharge Summary (Signed)
Patient ID: [name] MRN: [id number] DOB/AGE: 78-Oct-1942 78 y.o.  Admit date: 10/27/2017 Discharge date: 10/31/2017  Admission Diagnoses:  Principal Problem:   Displaced trimalleolar fracture of right ankle Active Problems:   DM (diabetes mellitus), type 2, uncontrolled with complications [facility](HCC)   Discharge Diagnoses:  Same  Past Medical History:  Diagnosis Date  . Arthritis   . Chest pain    Hospital 03/2012  // outpatient nuclear normal 04/02/2012  EF  80%  . Diabetes mellitus   . GERD (gastroesophageal reflux disease)   . Hyperlipidemia   . Hypertension   . Obesity   . Stress at home     Surgeries: Procedure(s): Right OPEN REDUCTION INTERNAL FIXATION (ORIF) ANKLE FRACTURE on 10/27/2017   Consultants:   Discharged Condition: Improved  Hospital Course: [name] is an 77 y.o. female who was admitted 10/27/2017 for operative treatment ofDisplaced trimalleolar fracture of right ankle. Patient has severe unremitting pain that affects sleep, daily activities, and work/hobbies. After pre-op clearance the patient was taken to the operating room on 10/27/2017 and underwent  Procedure(s): Right OPEN REDUCTION INTERNAL FIXATION (ORIF) ANKLE FRACTURE.    Patient was given perioperative antibiotics:  Anti-infectives (From admission, onward)   Start     Dose/Rate Route Frequency Ordered Stop   10/28/17 0600  ceFAZolin (ANCEF) IVPB 2g/100 mL premix  Status:  Discontinued     2 g 200 mL/hr over 30 Minutes Intravenous On call to O.R. 10/28/17 0058 10/28/17 0059   10/28/17 0400  ceFAZolin (ANCEF) IVPB 2g/100 mL premix     2 g 200 mL/hr over 30 Minutes Intravenous Every 6 hours 10/28/17 0059 10/28/17 1805       Patient was given sequential compression devices, early ambulation, and chemoprophylaxis to prevent DVT.  The patient was placed on sliding-scale insulin during the hospitalization.  Patient lives alone and made somewhat slow progress with physical therapy therefore  is medically in need of skilled nursing facility.  Patient benefited maximally from hospital stay and there were no complications.    Recent vital signs:  Patient Vitals for the past 24 hrs:  BP Temp Temp src Pulse Resp SpO2  10/31/17 0534 (!) 141/55 98.3 F (36.8 C) Oral 70 16 97 %  10/30/17 1900 (!) 113/49 97.9 F (36.6 C) Oral 70 16 98 %  10/30/17 1100 (!) 119/48 98 F (36.7 C) Oral 73 16 98 %     Recent laboratory studies: No results for input(s): WBC, HGB, HCT, PLT, NA, K, CL, CO2, BUN, CREATININE, GLUCOSE, INR, CALCIUM in the last 72 hours.  Invalid input(s): PT, 2   Discharge Medications:   Allergies as of 10/31/2017      Reactions   Liraglutide Nausea And Vomiting   Niacin Nausea And Vomiting   Atorvastatin Other (See Comments)   Myalgias   Darvocet [propoxyphene N-acetaminophen] Nausea Only   Metformin And Related Nausea Only   Propoxyphene Nausea Only      Medication List    STOP taking these medications   acetaminophen-codeine 300-30 MG tablet Commonly known as:  TYLENOL #3     TAKE these medications   allopurinol 300 MG tablet Commonly known as:  ZYLOPRIM Take 300 mg by mouth daily.   amLODipine 5 MG tablet Commonly known as:  NORVASC TAKE 1 TABLET (5 MG TOTAL) BY MOUTH DAILY.   aspirin EC 81 MG tablet Take 1 tablet (81 mg total) by mouth daily.   benazepril 40 MG tablet Commonly known as:  LOTENSIN  Take 40 mg by mouth daily.   cholecalciferol 1000 units tablet Commonly known as:  VITAMIN D Take 1,000 Units by mouth daily.   fenofibrate 160 MG tablet Take 160 mg by mouth daily.   fluticasone 50 MCG/ACT nasal spray Commonly known as:  FLONASE Place 1-2 sprays into both nostrils daily.   furosemide 20 MG tablet Commonly known as:  LASIX Take 30 mg by mouth daily. For fluid   glimepiride 4 MG tablet Commonly known as:  AMARYL Take 8 mg by mouth daily.   HYDROcodone-acetaminophen 5-325 MG tablet Commonly known as:  NORCO Take 1-2  tablets by mouth every 6 (six) hours as needed for moderate pain.   hydrocortisone 1 % lotion Apply 1 application topically 2 (two) times daily.   Insulin Glargine 100 UNIT/ML Solostar Pen Commonly known as:  LANTUS Inject 35-50 Units into the skin as directed.   levothyroxine 75 MCG tablet Commonly known as:  SYNTHROID, LEVOTHROID Take 75 mcg by mouth daily before breakfast.   metFORMIN 500 MG (MOD) 24 hr tablet Commonly known as:  GLUMETZA Take 500 mg by mouth daily with breakfast.   metoprolol succinate 100 MG 24 hr tablet Commonly known as:  TOPROL-XL Take 150 mg by mouth daily.   NITROSTAT 0.4 MG SL tablet Generic drug:  nitroGLYCERIN Place 0.4 mg under the tongue as directed. Place one tablet under the tongue every 5 minutes as needed for chest pain   pantoprazole 40 MG tablet Commonly known as:  PROTONIX Take 40 mg by mouth daily.   potassium chloride SA 20 MEQ tablet Commonly known as:  K-DUR,KLOR-CON Take 20 mEq by mouth 2 (two) times daily.   pravastatin 80 MG tablet Commonly known as:  PRAVACHOL Take 80 mg by mouth daily.            Discharge Care Instructions  (From admission, onward)        Start     Ordered   10/30/17 0000  Non weight bearing    Question Answer Comment  Laterality right   Extremity Lower      10/30/17 1017   10/28/17 0000  Non weight bearing    Question Answer Comment  Laterality right   Extremity Lower      10/28/17 0012      Diagnostic Studies: Dg Shoulder Right  Result Date: 10/27/2017 CLINICAL DATA:  Fall, shoulder pain. EXAM: RIGHT SHOULDER - 2+ VIEW COMPARISON:  None. FINDINGS: Fixation hardware within the proximal right humerus appears intact and appropriately position. No osseous fracture line or displaced fracture fragment identified. Humeral head is grossly well positioned relative to the adjacent glenoid fossa. Soft tissues about the right shoulder are unremarkable. IMPRESSION: No acute findings. Fixation  hardware within the proximal right humerus appears intact and appropriately positioned. Electronically Signed   By: Bary Richard M.D.   On: 10/27/2017 13:47   Dg Ankle Complete Right  Result Date: 10/27/2017 CLINICAL DATA:  77 year old female status post reduction of right ankle trimalleolar fracture. EXAM: RIGHT ANKLE - COMPLETE 3+ VIEW COMPARISON:  Earlier radiograph dated 10/27/2017 FINDINGS: Trimalleolar fracture again noted. There is displaced and mildly angulated fractures of the distal fibula and posterior malleolus. There is persistent dorsal dislocation of the ankle with medial tilt of the talar dome and asymmetry of the joint space at the ankle mortise. A fiberglass cast is noted. IMPRESSION: Trimalleolar fracture with persistent posteriorly dislocated ankle. Electronically Signed   By: Elgie Collard M.D.   On: 10/27/2017 19:34  Dg Ankle Complete Right  Result Date: 10/27/2017 CLINICAL DATA:  Fall, right shoulder and ankle pain. EXAM: RIGHT ANKLE - COMPLETE 3+ VIEW COMPARISON:  None. FINDINGS: Displaced fracture within the medial malleolus. Additional displaced fracture within the distal fibula, obliquely oriented, centered at the upper margin of the lateral malleolus. Based on the lateral view, the distal tibia appears to be anteriorly subluxed relative to the talar dome. Ankle mortise is otherwise grossly symmetric and talar dome appears intact. Visualized portions of the hindfoot and midfoot appear intact and normally aligned. IMPRESSION: 1. Displaced fractures within the medial malleolus and distal fibula. 2. Based on the lateral view, distal tibia appears to be anteriorly displaced relative to the talar dome. Ankle mortise otherwise grossly symmetric. Electronically Signed   By: Bary Richard M.D.   On: 10/27/2017 13:46   Dg Ankle Right Port  Result Date: 10/27/2017 CLINICAL DATA:  Post reduction. EXAM: PORTABLE RIGHT ANKLE - 2 VIEW COMPARISON:  October 27, 2017 FINDINGS: A single  lateral view was obtained. The displaced fibular fracture remains similar in the interval. There appears to be a posterior malleolus fracture is well, likely not changed. The tibia is displaced anteriorly relative to the talus, slightly improved. A medial malleolus fracture is identified. IMPRESSION: Tri malleolar fractures with continued displacement. The tibia remains mildly anteriorly displaced relative to the talus, mildly improved. Electronically Signed   By: Gerome Sam III M.D   On: 10/27/2017 18:04    Disposition: 01-Home or Self Care  Discharge Instructions    Call MD / Call 911   Complete by:  As directed    If you experience chest pain or shortness of breath, CALL 911 and be transported to the hospital emergency room.  If you develope a fever above 101 F, pus (white drainage) or increased drainage or redness at the wound, or calf pain, call your surgeon's office.   Consult to diabetes coordinator   Complete by:  As directed    Diet Carb Modified   Complete by:  As directed    Increase activity slowly as tolerated   Complete by:  As directed    Non weight bearing   Complete by:  As directed    Laterality:  right   Extremity:  Lower   Non weight bearing   Complete by:  As directed    Laterality:  right   Extremity:  Lower       Contact information for follow-up providers    Jodi Geralds, MD. Schedule an appointment as soon as possible for a visit in 2 weeks.   Specialty:  Orthopedic Surgery Contact information: [address] [place] [address] [phone]            Contact information for after-discharge care    Destination    [facility] .   Service:  Skilled Nursing Contact information: [address] [place] [address] [phone]                   Signed: Matthew Folks 10/31/2017, 8:21 AM

## 2017-10-31 NOTE — Plan of Care (Signed)
  Progressing Education: Knowledge of General Education information will improve 10/31/2017 0947 - Progressing by Quentin CornwallMadison, Allison Deshotels, RN Health Behavior/Discharge Planning: Ability to manage health-related needs will improve 10/31/2017 0947 - Progressing by Quentin CornwallMadison, Kenly Henckel, RN Clinical Measurements: Ability to maintain clinical measurements within normal limits will improve 10/31/2017 0947 - Progressing by Quentin CornwallMadison, Joss Friedel, RN Will remain free from infection 10/31/2017 0947 - Progressing by Quentin CornwallMadison, Jesper Stirewalt, RN Diagnostic test results will improve 10/31/2017 0947 - Progressing by Quentin CornwallMadison, Jamason Peckham, RN Respiratory complications will improve 10/31/2017 0947 - Progressing by Quentin CornwallMadison, Farrel Guimond, RN Cardiovascular complication will be avoided 10/31/2017 0947 - Progressing by Quentin CornwallMadison, Krishay Faro, RN Activity: Risk for activity intolerance will decrease 10/31/2017 0947 - Progressing by Quentin CornwallMadison, Claris Guymon, RN Nutrition: Adequate nutrition will be maintained 10/31/2017 0947 - Progressing by Quentin CornwallMadison, Kaelene Elliston, RN Coping: Level of anxiety will decrease 10/31/2017 0947 - Progressing by Quentin CornwallMadison, Bransen Fassnacht, RN Elimination: Will not experience complications related to bowel motility 10/31/2017 0947 - Progressing by Quentin CornwallMadison, Ivann Trimarco, RN Will not experience complications related to urinary retention 10/31/2017 0947 - Progressing by Quentin CornwallMadison, Micaylah Bertucci, RN Pain Managment: General experience of comfort will improve 10/31/2017 0947 - Progressing by Quentin CornwallMadison, Braydin Aloi, RN Safety: Ability to remain free from injury will improve 10/31/2017 0947 - Progressing by Quentin CornwallMadison, Marquita Lias, RN Skin Integrity: Risk for impaired skin integrity will decrease 10/31/2017 0947 - Progressing by Quentin CornwallMadison, Pheonix Wisby, RN

## 2017-11-29 ENCOUNTER — Emergency Department (HOSPITAL_COMMUNITY): Payer: Medicare HMO

## 2017-11-29 ENCOUNTER — Other Ambulatory Visit: Payer: Self-pay

## 2017-11-29 ENCOUNTER — Inpatient Hospital Stay (HOSPITAL_COMMUNITY)
Admission: EM | Admit: 2017-11-29 | Discharge: 2017-12-02 | DRG: 641 | Disposition: A | Discharge status: Skilled Nursing Facility | Payer: Medicare HMO | Attending: Internal Medicine | Admitting: Internal Medicine

## 2017-11-29 ENCOUNTER — Encounter (HOSPITAL_COMMUNITY): Payer: Self-pay

## 2017-11-29 DIAGNOSIS — E669 Obesity, unspecified: Secondary | ICD-10-CM | POA: Diagnosis present

## 2017-11-29 DIAGNOSIS — N289 Disorder of kidney and ureter, unspecified: Secondary | ICD-10-CM | POA: Diagnosis not present

## 2017-11-29 DIAGNOSIS — K219 Gastro-esophageal reflux disease without esophagitis: Secondary | ICD-10-CM | POA: Diagnosis not present

## 2017-11-29 DIAGNOSIS — B9620 Unspecified Escherichia coli [E. coli] as the cause of diseases classified elsewhere: Secondary | ICD-10-CM | POA: Diagnosis present

## 2017-11-29 DIAGNOSIS — K5900 Constipation, unspecified: Secondary | ICD-10-CM | POA: Diagnosis present

## 2017-11-29 DIAGNOSIS — R8271 Bacteriuria: Secondary | ICD-10-CM | POA: Diagnosis not present

## 2017-11-29 DIAGNOSIS — Z87891 Personal history of nicotine dependence: Secondary | ICD-10-CM

## 2017-11-29 DIAGNOSIS — Z6829 Body mass index (BMI) 29.0-29.9, adult: Secondary | ICD-10-CM | POA: Diagnosis not present

## 2017-11-29 DIAGNOSIS — E875 Hyperkalemia: Principal | ICD-10-CM

## 2017-11-29 DIAGNOSIS — R000 Tachycardia, unspecified: Secondary | ICD-10-CM | POA: Diagnosis not present

## 2017-11-29 DIAGNOSIS — Z888 Allergy status to other drugs, medicaments and biological substances status: Secondary | ICD-10-CM

## 2017-11-29 DIAGNOSIS — E785 Hyperlipidemia, unspecified: Secondary | ICD-10-CM | POA: Diagnosis present

## 2017-11-29 DIAGNOSIS — E860 Dehydration: Secondary | ICD-10-CM | POA: Diagnosis not present

## 2017-11-29 DIAGNOSIS — Z885 Allergy status to narcotic agent status: Secondary | ICD-10-CM

## 2017-11-29 DIAGNOSIS — M1990 Unspecified osteoarthritis, unspecified site: Secondary | ICD-10-CM | POA: Diagnosis present

## 2017-11-29 DIAGNOSIS — S82891A Other fracture of right lower leg, initial encounter for closed fracture: Secondary | ICD-10-CM | POA: Diagnosis present

## 2017-11-29 DIAGNOSIS — N179 Acute kidney failure, unspecified: Secondary | ICD-10-CM | POA: Diagnosis present

## 2017-11-29 DIAGNOSIS — R0989 Other specified symptoms and signs involving the circulatory and respiratory systems: Secondary | ICD-10-CM | POA: Diagnosis not present

## 2017-11-29 DIAGNOSIS — T464X5A Adverse effect of angiotensin-converting-enzyme inhibitors, initial encounter: Secondary | ICD-10-CM | POA: Diagnosis present

## 2017-11-29 DIAGNOSIS — Z993 Dependence on wheelchair: Secondary | ICD-10-CM

## 2017-11-29 DIAGNOSIS — IMO0002 Reserved for concepts with insufficient information to code with codable children: Secondary | ICD-10-CM | POA: Diagnosis present

## 2017-11-29 DIAGNOSIS — Z79899 Other long term (current) drug therapy: Secondary | ICD-10-CM

## 2017-11-29 DIAGNOSIS — N390 Urinary tract infection, site not specified: Secondary | ICD-10-CM | POA: Diagnosis present

## 2017-11-29 DIAGNOSIS — S82891S Other fracture of right lower leg, sequela: Secondary | ICD-10-CM | POA: Diagnosis not present

## 2017-11-29 DIAGNOSIS — E11649 Type 2 diabetes mellitus with hypoglycemia without coma: Secondary | ICD-10-CM | POA: Diagnosis present

## 2017-11-29 DIAGNOSIS — E876 Hypokalemia: Secondary | ICD-10-CM | POA: Insufficient documentation

## 2017-11-29 DIAGNOSIS — D62 Acute posthemorrhagic anemia: Secondary | ICD-10-CM | POA: Diagnosis not present

## 2017-11-29 DIAGNOSIS — I10 Essential (primary) hypertension: Secondary | ICD-10-CM | POA: Diagnosis present

## 2017-11-29 DIAGNOSIS — Z7982 Long term (current) use of aspirin: Secondary | ICD-10-CM | POA: Diagnosis not present

## 2017-11-29 DIAGNOSIS — Z794 Long term (current) use of insulin: Secondary | ICD-10-CM | POA: Diagnosis not present

## 2017-11-29 DIAGNOSIS — R569 Unspecified convulsions: Secondary | ICD-10-CM | POA: Diagnosis present

## 2017-11-29 DIAGNOSIS — E118 Type 2 diabetes mellitus with unspecified complications: Secondary | ICD-10-CM | POA: Diagnosis not present

## 2017-11-29 DIAGNOSIS — T486X5A Adverse effect of antiasthmatics, initial encounter: Secondary | ICD-10-CM | POA: Diagnosis present

## 2017-11-29 DIAGNOSIS — E1165 Type 2 diabetes mellitus with hyperglycemia: Secondary | ICD-10-CM | POA: Diagnosis not present

## 2017-11-29 LAB — CBC WITH DIFFERENTIAL/PLATELET
BASOS ABS: 0 10*3/uL (ref 0.0–0.1)
Basophils Relative: 1 %
EOS PCT: 4 %
Eosinophils Absolute: 0.2 10*3/uL (ref 0.0–0.7)
HEMATOCRIT: 38.4 % (ref 36.0–46.0)
Hemoglobin: 12 g/dL (ref 12.0–15.0)
LYMPHS ABS: 1.3 10*3/uL (ref 0.7–4.0)
LYMPHS PCT: 21 %
MCH: 31.4 pg (ref 26.0–34.0)
MCHC: 31.3 g/dL (ref 30.0–36.0)
MCV: 100.5 fL — AB (ref 78.0–100.0)
MONO ABS: 0.7 10*3/uL (ref 0.1–1.0)
Monocytes Relative: 11 %
NEUTROS ABS: 4 10*3/uL (ref 1.7–7.7)
Neutrophils Relative %: 63 %
PLATELETS: 211 10*3/uL (ref 150–400)
RBC: 3.82 MIL/uL — ABNORMAL LOW (ref 3.87–5.11)
RDW: 16.6 % — ABNORMAL HIGH (ref 11.5–15.5)
WBC: 6.3 10*3/uL (ref 4.0–10.5)

## 2017-11-29 LAB — COMPREHENSIVE METABOLIC PANEL
ALBUMIN: 3.3 g/dL — AB (ref 3.5–5.0)
ALT: 15 U/L (ref 14–54)
ANION GAP: 10 (ref 5–15)
AST: 27 U/L (ref 15–41)
Alkaline Phosphatase: 79 U/L (ref 38–126)
BILIRUBIN TOTAL: 0.5 mg/dL (ref 0.3–1.2)
BUN: 44 mg/dL — AB (ref 6–20)
CHLORIDE: 107 mmol/L (ref 101–111)
CO2: 21 mmol/L — AB (ref 22–32)
Calcium: 9.2 mg/dL (ref 8.9–10.3)
Creatinine, Ser: 1.91 mg/dL — ABNORMAL HIGH (ref 0.44–1.00)
GFR calc Af Amer: 28 mL/min — ABNORMAL LOW (ref >60)
GFR calc non Af Amer: 24 mL/min — ABNORMAL LOW (ref >60)
Glucose, Bld: 196 mg/dL — ABNORMAL HIGH (ref 65–99)
POTASSIUM: 6.9 mmol/L — AB (ref 3.5–5.1)
SODIUM: 138 mmol/L (ref 135–145)
TOTAL PROTEIN: 6.7 g/dL (ref 6.5–8.1)

## 2017-11-29 LAB — URINALYSIS, ROUTINE W REFLEX MICROSCOPIC
Bilirubin Urine: NEGATIVE
Glucose, UA: NEGATIVE mg/dL
HGB URINE DIPSTICK: NEGATIVE
Ketones, ur: NEGATIVE mg/dL
Nitrite: NEGATIVE
PROTEIN: NEGATIVE mg/dL
Specific Gravity, Urine: 1.012 (ref 1.005–1.030)
pH: 5 (ref 5.0–8.0)

## 2017-11-29 LAB — I-STAT CHEM 8, ED
BUN: 40 mg/dL — ABNORMAL HIGH (ref 6–20)
CALCIUM ION: 1.13 mmol/L — AB (ref 1.15–1.40)
Chloride: 108 mmol/L (ref 101–111)
Creatinine, Ser: 1.6 mg/dL — ABNORMAL HIGH (ref 0.44–1.00)
Glucose, Bld: 220 mg/dL — ABNORMAL HIGH (ref 65–99)
HCT: 33 % — ABNORMAL LOW (ref 36.0–46.0)
HEMOGLOBIN: 11.2 g/dL — AB (ref 12.0–15.0)
Potassium: 5.4 mmol/L — ABNORMAL HIGH (ref 3.5–5.1)
SODIUM: 140 mmol/L (ref 135–145)
TCO2: 22 mmol/L (ref 22–32)

## 2017-11-29 LAB — CBG MONITORING, ED: GLUCOSE-CAPILLARY: 300 mg/dL — AB (ref 65–99)

## 2017-11-29 MED ORDER — SODIUM CHLORIDE 0.9 % IV BOLUS
250.0000 mL | Freq: Once | INTRAVENOUS | Status: AC
  Administered 2017-11-29: 250 mL via INTRAVENOUS

## 2017-11-29 MED ORDER — INSULIN ASPART 100 UNIT/ML IV SOLN
5.0000 [IU] | Freq: Once | INTRAVENOUS | Status: AC
  Administered 2017-11-29: 5 [IU] via INTRAVENOUS
  Filled 2017-11-29: qty 0.05

## 2017-11-29 MED ORDER — SODIUM BICARBONATE 8.4 % IV SOLN
50.0000 meq | Freq: Once | INTRAVENOUS | Status: AC
  Administered 2017-11-29: 50 meq via INTRAVENOUS
  Filled 2017-11-29: qty 50

## 2017-11-29 MED ORDER — ALBUTEROL SULFATE (2.5 MG/3ML) 0.083% IN NEBU
10.0000 mg | INHALATION_SOLUTION | Freq: Once | RESPIRATORY_TRACT | Status: AC
  Administered 2017-11-29: 10 mg via RESPIRATORY_TRACT
  Filled 2017-11-29: qty 12

## 2017-11-29 MED ORDER — SODIUM CHLORIDE 0.9 % IV BOLUS
500.0000 mL | Freq: Once | INTRAVENOUS | Status: AC
  Administered 2017-11-29: 500 mL via INTRAVENOUS

## 2017-11-29 MED ORDER — DEXTROSE 50 % IV SOLN
1.0000 | Freq: Once | INTRAVENOUS | Status: AC
  Administered 2017-11-29: 50 mL via INTRAVENOUS
  Filled 2017-11-29: qty 50

## 2017-11-29 MED ORDER — SODIUM CHLORIDE 0.9 % IV BOLUS
500.0000 mL | Freq: Once | INTRAVENOUS | Status: AC
  Administered 2017-11-30: 500 mL via INTRAVENOUS

## 2017-11-29 MED ORDER — SODIUM CHLORIDE 0.9 % IV BOLUS
1000.0000 mL | Freq: Once | INTRAVENOUS | Status: AC
  Administered 2017-11-29: 1000 mL via INTRAVENOUS

## 2017-11-29 NOTE — H&P (Signed)
Nicole CrutchLucille D Scullion ZOX:[id number] DOB: 12-23-1940 DOA: 11/29/2017     PCP: [name], Teresa, FNP  [facility]   Patient arrived to ER on 11/29/17 at 1539  [facility]:    [name]  Chief Complaint:  Chief Complaint  Patient presents with  . Weakness    HPI: [name] is a 77 y.o. female with medical history significant of  DM2 recent right ankle FRX, HLD, HTN, GERD    Presented with abnormal labs potassium 7.0 patient has been reporting some generalized fatigue.  Decreased p.o. intake recently her potassium supplementation has been doubled. Recently undergone right ankle repair status post fracture has been discharged to SNF.  Reports no  disuria but some chills. No SOB no chest pain.  Reports diarrhea and occasional vomiting x2 . States she has been on stool softeners.  Regarding pertinent Chronic problems: History of diabetes mellitus   While in ER: Confirm hyperkalemia and started on albuterol and bicarb insulin and dextrose after administration of albuterol became tachycardic  Following Medications were ordered in ER: Medications  sodium chloride 0.9 % bolus 250 mL (0 mLs Intravenous Stopped 11/29/17 1808)  albuterol (PROVENTIL) (2.5 MG/3ML) 0.083% nebulizer solution 10 mg (10 mg Nebulization Given 11/29/17 1803)  dextrose 50 % solution 50 mL (50 mLs Intravenous Given 11/29/17 1844)  insulin aspart (novoLOG) injection 5 Units (5 Units Intravenous Given 11/29/17 1843)  sodium chloride 0.9 % bolus 500 mL (0 mLs Intravenous Stopped 11/29/17 1926)  sodium bicarbonate injection 50 mEq (50 mEq Intravenous Given 11/29/17 1942)  sodium chloride 0.9 % bolus 1,000 mL (0 mLs Intravenous Stopped 11/29/17 2043)    Significant initial  Findings: Abnormal Labs Reviewed  COMPREHENSIVE METABOLIC PANEL - Abnormal; Notable for the following components:      Result Value   Potassium 6.9 (*)    CO2 21 (*)    Glucose,  Bld 196 (*)    BUN 44 (*)    Creatinine, Ser 1.91 (*)    Albumin 3.3 (*)    GFR calc non Af Amer 24 (*)    GFR calc Af Amer 28 (*)    All other components within normal limits  CBC WITH DIFFERENTIAL/PLATELET - Abnormal; Notable for the following components:   RBC 3.82 (*)    MCV 100.5 (*)    RDW 16.6 (*)    All other components within normal limits  URINALYSIS, ROUTINE W REFLEX MICROSCOPIC - Abnormal; Notable for the following components:   Leukocytes, UA TRACE (*)    Bacteria, UA MANY (*)    Squamous Epithelial / LPF 0-5 (*)    All other components within normal limits  CBG MONITORING, ED - Abnormal; Notable for the following components:   Glucose-Capillary 300 (*)    All other components within normal limits  I-STAT CHEM 8, ED - Abnormal; Notable for the following components:   Potassium 5.4 (*)    BUN 40 (*)    Creatinine, Ser 1.60 (*)    Glucose, Bld 220 (*)    Calcium, Ion 1.13 (*)    Hemoglobin 11.2 (*)    HCT 33.0 (*)    All other components within normal limits     Na 138 K 6.9> 5.4  Cr  Up from baseline see below Lab Results  Component Value Date   CREATININE 1.60 (H) 11/29/2017   CREATININE 1.91 (H) 11/29/2017   CREATININE 0.85 10/28/2017      WBC 6.3  HG/HCT   stable,      Component Value Date/Time   HGB 12.0 11/29/2017 1631   HCT 38.4 11/29/2017 1631       UA  not ordered  CXR -  NON acute   ECG:  Personally reviewed by me showing: HR : 111 Rhythm: atrial tachycardia   no evidence of ischemic changes QTC 393      ED Triage Vitals  Enc Vitals Group     BP 11/29/17 1542 124/66     Pulse Rate 11/29/17 1542 (!) 110     Resp 11/29/17 1542 16     Temp 11/29/17 1542 98.9 F (37.2 C)     Temp Source 11/29/17 1542 Oral     SpO2 11/29/17 1542 100 %     Weight 11/29/17 1634 180 lb (81.6 kg)     Height 11/29/17 1634 5\' 5"  (1.651 m)     Head Circumference --      Peak Flow --      Pain Score --      Pain Loc --      Pain Edu? --       Excl. in GC? --   TMAX(24)@       Latest  Blood pressure 113/87, pulse (!) 129, temperature 98 F (36.7 C), temperature source Oral, resp. rate 18, height 5\' 5"  (1.651 m), weight 81.6 kg (180 lb), SpO2 99 %.    Hospitalist was called for admission for hyperkalemia   Review of Systems:    Pertinent positives include:  fatigue, increased p.o. intake  Constitutional:  No weight loss, night sweats, Fevers, chills, weight loss  HEENT:  No headaches, Difficulty swallowing,Tooth/dental problems,Sore throat,  No sneezing, itching, ear ache, nasal congestion, post nasal drip,  Cardio-vascular:  No chest pain, Orthopnea, PND, anasarca, dizziness, palpitations.no Bilateral lower extremity swelling  GI:  No heartburn, indigestion, abdominal pain, nausea, vomiting, diarrhea, change in bowel habits, loss of appetite, melena, blood in stool, hematemesis Resp:  no shortness of breath at rest. No dyspnea on exertion, No excess mucus, no productive cough, No non-productive cough, No coughing up of blood.No change in color of mucus No wheezing. Skin:  no rash or lesions. No jaundice GU:  no dysuria, change in color of urine, no urgency or frequency. No straining to urinate.  No flank pain.  Musculoskeletal:  No joint pain or no joint swelling. No decreased range of motion. No back pain.  Psych:  No change in mood or affect. No depression or anxiety. No memory loss.  Neuro: no localizing neurological complaints, no tingling, no weakness, no double vision, no gait abnormality, no slurred speech, no confusion  As per HPI otherwise 10 point review of systems negative.   Past Medical History:   Past Medical History:  Diagnosis Date  . Arthritis   . Chest pain    Hospital 03/2012  // outpatient nuclear normal 04/02/2012  EF  80%  . Diabetes mellitus   . GERD (gastroesophageal reflux disease)   . Hyperlipidemia   . Hypertension   . Obesity   . Stress at home       Past Surgical History:    Procedure Laterality Date  . ABDOMINAL HYSTERECTOMY    . BACK SURGERY    . ESOPHAGOGASTRODUODENOSCOPY N/A 03/07/2015   Procedure: ESOPHAGOGASTRODUODENOSCOPY (EGD);  Surgeon: Charolett Bumpers, MD;  Location: [facility];  Service: Endoscopy;  Laterality: N/A;  NO PROPOFOL  . ORIF ANKLE FRACTURE Right 10/27/2017   Procedure: OPEN REDUCTION  INTERNAL FIXATION (ORIF) ANKLE FRACTURE;  Surgeon: Jodi Geralds, MD;  Location: [facility] OR;  Service: Orthopedics;  Laterality: Right;  . SHOULDER SURGERY      Social History:  Ambulatory   wheelchair bound,       reports that she quit smoking about 45 years ago. Her smoking use included cigarettes. She smoked 0.30 packs per day for 0.00 years. She has never used smokeless tobacco. She reports that she does not drink alcohol or use drugs.     Family History:   Family History  Problem Relation Age of Onset  . Heart failure Mother   . Heart attack Father     Allergies: Allergies  Allergen Reactions  . Liraglutide Nausea And Vomiting  . Niacin Nausea And Vomiting  . Atorvastatin Other (See Comments)    Myalgias  . Darvocet [Propoxyphene N-Acetaminophen] Nausea Only  . Metformin And Related Nausea Only  . Propoxyphene Nausea Only     Prior to Admission medications   Medication Sig Start Date End Date Taking? Authorizing Provider  allopurinol (ZYLOPRIM) 300 MG tablet Take 300 mg by mouth daily.    [provider]  amLODipine (NORVASC) 5 MG tablet TAKE 1 TABLET (5 MG TOTAL) BY MOUTH DAILY. 06/16/13   Luis Abed, MD  aspirin EC 81 MG tablet Take 1 tablet (81 mg total) by mouth daily. 10/28/17 10/28/18  Marshia Ly, PA-C  benazepril (LOTENSIN) 40 MG tablet Take 40 mg by mouth daily.    [provider]  cholecalciferol (VITAMIN D) 1000 UNITS tablet Take 1,000 Units by mouth daily.      [provider]  fenofibrate 160 MG tablet Take 160 mg by mouth daily. 08/03/15   [provider]  fluticasone (FLONASE) 50  MCG/ACT nasal spray Place 1-2 sprays into both nostrils daily. 08/29/17   [provider]  furosemide (LASIX) 20 MG tablet Take 30 mg by mouth daily. For fluid 12/20/14   [provider]  glimepiride (AMARYL) 4 MG tablet Take 8 mg by mouth daily. 12/20/14   [provider]  HYDROcodone-acetaminophen (NORCO) 5-325 MG tablet Take 1-2 tablets by mouth every 6 (six) hours as needed for moderate pain. 10/28/17   Marshia Ly, PA-C  hydrocortisone 1 % lotion Apply 1 application topically 2 (two) times daily. 04/02/16   Johna Sheriff, MD  Insulin Glargine (LANTUS) 100 UNIT/ML Solostar Pen Inject 35-50 Units into the skin as directed. 03/03/15   [provider]  levothyroxine (SYNTHROID, LEVOTHROID) 75 MCG tablet Take 75 mcg by mouth daily before breakfast. 08/11/14   [provider]  metFORMIN (GLUMETZA) 500 MG (MOD) 24 hr tablet Take 500 mg by mouth daily with breakfast.    [provider]  metoprolol succinate (TOPROL-XL) 100 MG 24 hr tablet Take 150 mg by mouth daily.  02/01/15   [provider]  nitroGLYCERIN (NITROSTAT) 0.4 MG SL tablet Place 0.4 mg under the tongue as directed. Place one tablet under the tongue every 5 minutes as needed for chest pain 03/22/15   [provider]  pantoprazole (PROTONIX) 40 MG tablet Take 40 mg by mouth daily. 08/03/15   [provider]  potassium chloride SA (K-DUR,KLOR-CON) 20 MEQ tablet Take 20 mEq by mouth 2 (two) times daily.    [provider]  pravastatin (PRAVACHOL) 80 MG tablet Take 80 mg by mouth daily. 04/09/14   [provider]   Physical Exam: Blood pressure 113/87, pulse (!) 129, temperature 98 F (36.7 C), temperature source  Oral, resp. rate 18, height 5\' 5"  (1.651 m), weight 81.6 kg (180 lb), SpO2 99 %. 1. General:  in No Acute distress  Chronically ill  -appearing 2. Psychological: Alert and   Oriented 3. Head/ENT:    Dry Mucous Membranes                           Head Non traumatic, neck supple                            Poor Dentition 4. SKIN:   decreased Skin turgor,  Skin clean Dry and intact no rash 5. Heart: Regular rate and rhythm no  Murmur, no Rub or gallop 6. Lungs:  no wheezes or crackles   7. Abdomen: Soft, non-tender, Non distended    obese  bowel sounds present 8. Lower extremities: no clubbing, cyanosis, or edema, right foot in cast 9. Neurologically Grossly intact, moving all 4 extremities equally   10. MSK: Normal range of motion   LABS:     Recent Labs  Lab 11/29/17 1631  WBC 6.3  NEUTROABS 4.0  HGB 12.0  HCT 38.4  MCV 100.5*  PLT 211   Basic Metabolic Panel: Recent Labs  Lab 11/29/17 1631  NA 138  K 6.9*  CL 107  CO2 21*  GLUCOSE 196*  BUN 44*  CREATININE 1.91*  CALCIUM 9.2      Recent Labs  Lab 11/29/17 1631  AST 27  ALT 15  ALKPHOS 79  BILITOT 0.5  PROT 6.7  ALBUMIN 3.3*   No results for input(s): LIPASE, AMYLASE in the last 168 hours. No results for input(s): AMMONIA in the last 168 hours.    HbA1C: No results for input(s): HGBA1C in the last 72 hours. CBG: Recent Labs  Lab 11/29/17 1907  GLUCAP 300*      Urine analysis:    Component Value Date/Time   COLORURINE YELLOW 01/17/2015 0015   APPEARANCEUR CLEAR 01/17/2015 0015   LABSPEC 1.007 01/17/2015 0015   PHURINE 6.0 01/17/2015 0015   GLUCOSEU NEGATIVE 01/17/2015 0015   HGBUR NEGATIVE 01/17/2015 0015   BILIRUBINUR NEGATIVE 01/17/2015 0015   KETONESUR NEGATIVE 01/17/2015 0015   PROTEINUR NEGATIVE 01/17/2015 0015   UROBILINOGEN 0.2 01/17/2015 0015   NITRITE NEGATIVE 01/17/2015 0015   LEUKOCYTESUR NEGATIVE 01/17/2015 0015       Cultures: No results found for: SDES, SPECREQUEST, CULT, REPTSTATUS   Radiological Exams on Admission: Dg Chest Portable 1 View  Result Date: 11/29/2017 CLINICAL DATA:  Shortness of breath EXAM: PORTABLE CHEST 1 VIEW COMPARISON:  01/16/2015 FINDINGS: Postsurgical changes in the proximal right  humerus. Coarse interstitial opacity consistent with chronic change. No acute consolidation or effusion. Normal heart size. Aortic atherosclerosis. No pneumothorax. IMPRESSION: No active disease.  Chronic appearing bronchitic changes. Electronically Signed   By: Jasmine Pang M.D.   On: 11/29/2017 20:06    Chart has been reviewed    Assessment/Plan  77 y.o. female with medical history significant of  DM2 recent right ankle FRX, HLD, HTN, GERD Admitted for hyperkalemia  Present on Admission: . Hyperkalemia - DC potassium supplements and lisinopril,  currently improving monitor  on tele.   Sinus tachycardia - in the setting of recent use of albuterol, given persistent with hx of recent trauma will check d.dimer. If positive will get VQ in AM . AKI (acute kidney injury) (HCC) - will rehydrate . Hypertension - restart  home medications with holding parameters, hold ACE and LAsix . Hyperlipidemia - stable continue statin . GERD (gastroesophageal reflux disease) - continue protonix . DM (diabetes mellitus), type 2, uncontrolled with complications (HCC) -  - Order Sensitive  SSI   - continue home insulin regimen    Lantus 10 units,  -  check TSH and HgA1C  - Hold by mouth medications    . Dehydration - will rehydrate  . Bacteria in urine - given chills treat for possible UTI, obtain urine culture   Other plan as per orders.  DVT prophylaxis:    Lovenox     Code Status:  FULL CODE  as per patient   I had personally discussed CODE STATUS with patient and      Family Communication:   Family not  at  Bedside    Disposition Plan:      Back to current facility when stable                                               Would benefit from PT/OT eval prior to DC  ordered                     Social Work                           Consults called: none  Admission status:   inpatient      Level of care      SDU given significant  tachycardia  change to tele if vitals stabalize       Charlise Giovanetti 11/29/2017, 11:25 PM     [facility]  Pager [phone]   after 2 AM please page floor coverage PA If 7AM-7PM, please contact the day team taking care of the patient  [email or web address]  Password [email or web address]

## 2017-11-29 NOTE — ED Triage Notes (Signed)
[facility] for abnormal labs- potassium 7.0. Pt c/o generalized weakness. Pt A+Ox4 and is in NAD at this time. Pt has hx of diabetes.

## 2017-11-29 NOTE — ED Provider Notes (Signed)
[facility] EMERGENCY DEPARTMENT Provider Note   CSN: [id number] Arrival date & time: 11/29/17  1539     History   Chief Complaint Chief Complaint  Patient presents with  . Weakness    HPI [name] is a 77 y.o. female.  HPI Patient brought in from [facility] for hyperkalemia with potassium of 7.0 and creatinine of 2.  Has had generalized weakness.  Reportedly has had decreased oral intake due to not liking the food there.  Per patient and family has had an increase of her potassium supplementation also.  Patient still has been urinating. Past Medical History:  Diagnosis Date  . Arthritis   . Chest pain    Hospital 03/2012  // outpatient nuclear normal 04/02/2012  EF  80%  . Diabetes mellitus   . GERD (gastroesophageal reflux disease)   . Hyperlipidemia   . Hypertension   . Obesity   . Stress at home     Patient Active Problem List   Diagnosis Date Noted  . Displaced trimalleolar fracture of right ankle 10/27/2017  . BMI 32.0-32.9,adult 04/02/2016  . Skin candidiasis 04/02/2016  . Chest pain   . Atypical chest pain 03/30/2012  . DM (diabetes mellitus), type 2, uncontrolled with complications (HCC) 03/30/2012  . Hypertension   . GERD (gastroesophageal reflux disease)   . Hyperlipidemia   . Arthritis     Past Surgical History:  Procedure Laterality Date  . ABDOMINAL HYSTERECTOMY    . BACK SURGERY    . ESOPHAGOGASTRODUODENOSCOPY N/A 03/07/2015   Procedure: ESOPHAGOGASTRODUODENOSCOPY (EGD);  Surgeon: Charolett Bumpers, MD;  Location: [facility];  Service: Endoscopy;  Laterality: N/A;  NO PROPOFOL  . ORIF ANKLE FRACTURE Right 10/27/2017   Procedure: OPEN REDUCTION INTERNAL FIXATION (ORIF) ANKLE FRACTURE;  Surgeon: Jodi Geralds, MD;  Location: [facility] OR;  Service: Orthopedics;  Laterality: Right;  . SHOULDER SURGERY       OB History   None      Home Medications    Prior to Admission medications   Medication Sig Start Date  End Date Taking? Authorizing Provider  allopurinol (ZYLOPRIM) 300 MG tablet Take 300 mg by mouth daily.    [provider]  amLODipine (NORVASC) 5 MG tablet TAKE 1 TABLET (5 MG TOTAL) BY MOUTH DAILY. 06/16/13   Luis Abed, MD  aspirin EC 81 MG tablet Take 1 tablet (81 mg total) by mouth daily. 10/28/17 10/28/18  Marshia Ly, PA-C  benazepril (LOTENSIN) 40 MG tablet Take 40 mg by mouth daily.    [provider]  cholecalciferol (VITAMIN D) 1000 UNITS tablet Take 1,000 Units by mouth daily.      [provider]  fenofibrate 160 MG tablet Take 160 mg by mouth daily. 08/03/15   [provider]  fluticasone (FLONASE) 50 MCG/ACT nasal spray Place 1-2 sprays into both nostrils daily. 08/29/17   [provider]  furosemide (LASIX) 20 MG tablet Take 30 mg by mouth daily. For fluid 12/20/14   [provider]  glimepiride (AMARYL) 4 MG tablet Take 8 mg by mouth daily. 12/20/14   [provider]  HYDROcodone-acetaminophen (NORCO) 5-325 MG tablet Take 1-2 tablets by mouth every 6 (six) hours as needed for moderate pain. 10/28/17   Marshia Ly, PA-C  hydrocortisone 1 % lotion Apply 1 application topically 2 (two) times daily. 04/02/16   Johna Sheriff, MD  Insulin Glargine (LANTUS) 100 UNIT/ML Solostar Pen Inject 35-50 Units into the skin as  directed. 03/03/15   [provider]  levothyroxine (SYNTHROID, LEVOTHROID) 75 MCG tablet Take 75 mcg by mouth daily before breakfast. 08/11/14   [provider]  metFORMIN (GLUMETZA) 500 MG (MOD) 24 hr tablet Take 500 mg by mouth daily with breakfast.    [provider]  metoprolol succinate (TOPROL-XL) 100 MG 24 hr tablet Take 150 mg by mouth daily.  02/01/15   [provider]  nitroGLYCERIN (NITROSTAT) 0.4 MG SL tablet Place 0.4 mg under the tongue as directed. Place one tablet under the tongue every 5 minutes as needed for chest pain 03/22/15   [provider]    pantoprazole (PROTONIX) 40 MG tablet Take 40 mg by mouth daily. 08/03/15   [provider]  potassium chloride SA (K-DUR,KLOR-CON) 20 MEQ tablet Take 20 mEq by mouth 2 (two) times daily.    [provider]  pravastatin (PRAVACHOL) 80 MG tablet Take 80 mg by mouth daily. 04/09/14   [provider]    Family History Family History  Problem Relation Age of Onset  . Heart failure Mother   . Heart attack Father     Social History Social History   Tobacco Use  . Smoking status: Former Smoker    Packs/day: 0.30    Years: 0.00    Pack years: 0.00    Types: Cigarettes    Last attempt to quit: 03/30/1972    Years since quitting: 45.6  . Smokeless tobacco: Never Used  . Tobacco comment: Quit 40 years ago  Substance Use Topics  . Alcohol use: No  . Drug use: No     Allergies   Liraglutide; Niacin; Atorvastatin; Darvocet [propoxyphene n-acetaminophen]; Metformin and related; and Propoxyphene   Review of Systems Review of Systems  Constitutional: Positive for appetite change and fatigue. Negative for fever.  Respiratory: Negative for shortness of breath.   Cardiovascular: Negative for chest pain.  Gastrointestinal: Negative for abdominal pain.  Genitourinary: Negative for flank pain.  Musculoskeletal: Negative for back pain.  Skin: Negative for rash.  Neurological: Positive for weakness.  Psychiatric/Behavioral: Negative for confusion.     Physical Exam Updated Vital Signs BP (!) 108/42   Pulse (!) 135   Temp 98 F (36.7 C) (Oral)   Resp (!) 25   Ht 5\' 5"  (1.651 m)   Wt 81.6 kg (180 lb)   SpO2 100%   BMI 29.95 kg/m   Physical Exam  Constitutional: She appears well-developed.  HENT:  Head: Atraumatic.  Mucous membranes somewhat dry  Eyes: Pupils are equal, round, and reactive to light.  Neck: Neck supple.  Cardiovascular: Regular rhythm.  Mild tachycardia  Pulmonary/Chest: Effort normal.  Abdominal: There is no tenderness.   Musculoskeletal: She exhibits no edema.  Neurological: She is alert.  Skin: Skin is warm. Capillary refill takes less than 2 seconds.  Psychiatric: She has a normal mood and affect.     ED Treatments / Results  Labs (all labs ordered are listed, but only abnormal results are displayed) Labs Reviewed  COMPREHENSIVE METABOLIC PANEL - Abnormal; Notable for the following components:      Result Value   Potassium 6.9 (*)    CO2 21 (*)    Glucose, Bld 196 (*)    BUN 44 (*)    Creatinine, Ser 1.91 (*)    Albumin 3.3 (*)    GFR calc non Af Amer 24 (*)    GFR calc Af Amer 28 (*)    All other components within normal  limits  CBC WITH DIFFERENTIAL/PLATELET - Abnormal; Notable for the following components:   RBC 3.82 (*)    MCV 100.5 (*)    RDW 16.6 (*)    All other components within normal limits  CBG MONITORING, ED - Abnormal; Notable for the following components:   Glucose-Capillary 300 (*)    All other components within normal limits  URINALYSIS, ROUTINE W REFLEX MICROSCOPIC  I-STAT CHEM 8, ED    EKG EKG Interpretation  Date/Time:  Friday November 29 2017 16:27:45 EDT Ventricular Rate:  111 PR Interval:    QRS Duration: 89 QT Interval:  292 QTC Calculation: 397 R Axis:   7 Text Interpretation:  Sinus or ectopic atrial tachycardia Prolonged PR interval Low voltage, precordial leads Abnormal R-wave progression, early transition Peaked T waves? Confirmed by Benjiman Core [phone]) on 11/29/2017 4:46:31 PM   EKG Interpretation  Date/Time:  Friday November 29 2017 19:06:58 EDT Ventricular Rate:  143 PR Interval:    QRS Duration: 84 QT Interval:  301 QTC Calculation: 465 R Axis:   7 Text Interpretation:  Junctional tachycardia vs sinus tachycardia Low voltage, precordial leads ST depression, probably rate related Confirmed by Benjiman Core [id number]) on 11/29/2017 7:12:42 PM           Radiology Dg Chest Portable 1 View  Result Date: 11/29/2017 CLINICAL DATA:   Shortness of breath EXAM: PORTABLE CHEST 1 VIEW COMPARISON:  01/16/2015 FINDINGS: Postsurgical changes in the proximal right humerus. Coarse interstitial opacity consistent with chronic change. No acute consolidation or effusion. Normal heart size. Aortic atherosclerosis. No pneumothorax. IMPRESSION: No active disease.  Chronic appearing bronchitic changes. Electronically Signed   By: Jasmine Pang M.D.   On: 11/29/2017 20:06    Procedures Procedures (including critical care time)  Medications Ordered in ED Medications  sodium chloride 0.9 % bolus 250 mL (0 mLs Intravenous Stopped 11/29/17 1808)  albuterol (PROVENTIL) (2.5 MG/3ML) 0.083% nebulizer solution 10 mg (10 mg Nebulization Given 11/29/17 1803)  dextrose 50 % solution 50 mL (50 mLs Intravenous Given 11/29/17 1844)  insulin aspart (novoLOG) injection 5 Units (5 Units Intravenous Given 11/29/17 1843)  sodium chloride 0.9 % bolus 500 mL (0 mLs Intravenous Stopped 11/29/17 1926)  sodium bicarbonate injection 50 mEq (50 mEq Intravenous Given 11/29/17 1942)  sodium chloride 0.9 % bolus 1,000 mL (0 mLs Intravenous Stopped 11/29/17 2043)     Initial Impression / Assessment and Plan / ED Course  I have reviewed the triage vital signs and the nursing notes.  Pertinent labs & imaging results that were available during my care of the patient were reviewed by me and considered in my medical decision making (see chart for details).     Patient sent in from rehab with hyperkalemia.  Increased creatinine and increased potassium.  Has been eating and drinking less so may be a dehydration component.  Also is on Lasix.  Given fluid boluses.  Given breathing treatments and insulin for her hyperkalemia.  Patient then developed a tachycardia.  Initially appeared to be sinus and may be a junctional rhythm.  Began after the albuterol.  Fluid bolus given and still has some persistent tachycardia.  Given bicarb also.  Will admit patient to hospitalist.  No dyspnea.   Does have renal insufficiency with recent surgery.  Pulmonary embolism considered with the tachycardia but not short of breath and this all may be secondary to dehydration/general illness.  CRITICAL CARE Performed by: Benjiman Core Total critical care time: 30 minutes Critical care time was  exclusive of separately billable procedures and treating other patients. Critical care was necessary to treat or prevent imminent or life-threatening deterioration. Critical care was time spent personally by me on the following activities: development of treatment plan with patient and/or surrogate as well as nursing, discussions with consultants, evaluation of patient's response to treatment, examination of patient, obtaining history from patient or surrogate, ordering and performing treatments and interventions, ordering and review of laboratory studies, ordering and review of radiographic studies, pulse oximetry and re-evaluation of patient's condition.   Final Clinical Impressions(s) / ED Diagnoses   Final diagnoses:  Hyperkalemia, diminished renal excretion  Renal insufficiency  Tachycardia    ED Discharge Orders    None       Benjiman Core, MD 11/29/17 2101

## 2017-11-30 ENCOUNTER — Inpatient Hospital Stay (HOSPITAL_COMMUNITY): Payer: Medicare HMO

## 2017-11-30 LAB — CBC
HCT: 31.3 % — ABNORMAL LOW (ref 36.0–46.0)
HEMOGLOBIN: 9.6 g/dL — AB (ref 12.0–15.0)
MCH: 30.7 pg (ref 26.0–34.0)
MCHC: 30.7 g/dL (ref 30.0–36.0)
MCV: 100 fL (ref 78.0–100.0)
Platelets: 173 10*3/uL (ref 150–400)
RBC: 3.13 MIL/uL — AB (ref 3.87–5.11)
RDW: 17.1 % — AB (ref 11.5–15.5)
WBC: 5.9 10*3/uL (ref 4.0–10.5)

## 2017-11-30 LAB — TROPONIN I
Troponin I: <0.03 ng/mL (ref <0.03)
Troponin I: <0.03 ng/mL (ref <0.03)

## 2017-11-30 LAB — CBG MONITORING, ED
GLUCOSE-CAPILLARY: 172 mg/dL — AB (ref 65–99)
GLUCOSE-CAPILLARY: 112 mg/dL — AB (ref 65–99)
GLUCOSE-CAPILLARY: 221 mg/dL — AB (ref 65–99)
GLUCOSE-CAPILLARY: 48 mg/dL — AB (ref 65–99)
GLUCOSE-CAPILLARY: 117 mg/dL — AB (ref 65–99)
GLUCOSE-CAPILLARY: 118 mg/dL — AB (ref 65–99)
GLUCOSE-CAPILLARY: 77 mg/dL (ref 65–99)
GLUCOSE-CAPILLARY: 124 mg/dL — AB (ref 65–99)
Glucose-Capillary: 70 mg/dL (ref 65–99)
Glucose-Capillary: 403 mg/dL — ABNORMAL HIGH (ref 65–99)
Glucose-Capillary: 67 mg/dL (ref 65–99)
Glucose-Capillary: 120 mg/dL — ABNORMAL HIGH (ref 65–99)

## 2017-11-30 LAB — COMPREHENSIVE METABOLIC PANEL
ALT: 10 U/L — ABNORMAL LOW (ref 14–54)
ANION GAP: 8 (ref 5–15)
AST: 23 U/L (ref 15–41)
Albumin: 2.5 g/dL — ABNORMAL LOW (ref 3.5–5.0)
Alkaline Phosphatase: 63 U/L (ref 38–126)
BILIRUBIN TOTAL: 0.5 mg/dL (ref 0.3–1.2)
BUN: 38 mg/dL — ABNORMAL HIGH (ref 6–20)
CO2: 19 mmol/L — ABNORMAL LOW (ref 22–32)
Calcium: 8.2 mg/dL — ABNORMAL LOW (ref 8.9–10.3)
Chloride: 111 mmol/L (ref 101–111)
Creatinine, Ser: 1.56 mg/dL — ABNORMAL HIGH (ref 0.44–1.00)
GFR, EST AFRICAN AMERICAN: 36 mL/min — AB (ref >60)
GFR, EST NON AFRICAN AMERICAN: 31 mL/min — AB (ref >60)
Glucose, Bld: 168 mg/dL — ABNORMAL HIGH (ref 65–99)
POTASSIUM: 6.2 mmol/L — AB (ref 3.5–5.1)
Sodium: 138 mmol/L (ref 135–145)
TOTAL PROTEIN: 5.2 g/dL — AB (ref 6.5–8.1)

## 2017-11-30 LAB — GLUCOSE, CAPILLARY
GLUCOSE-CAPILLARY: 171 mg/dL — AB (ref 65–99)
GLUCOSE-CAPILLARY: 201 mg/dL — AB (ref 65–99)
GLUCOSE-CAPILLARY: 267 mg/dL — AB (ref 65–99)

## 2017-11-30 LAB — I-STAT CHEM 8, ED
BUN: 29 mg/dL — ABNORMAL HIGH (ref 6–20)
CREATININE: 1.1 mg/dL — AB (ref 0.44–1.00)
Calcium, Ion: 1.21 mmol/L (ref 1.15–1.40)
Chloride: 113 mmol/L — ABNORMAL HIGH (ref 101–111)
Glucose, Bld: 87 mg/dL (ref 65–99)
HEMATOCRIT: 30 % — AB (ref 36.0–46.0)
HEMOGLOBIN: 10.2 g/dL — AB (ref 12.0–15.0)
POTASSIUM: 4.1 mmol/L (ref 3.5–5.1)
SODIUM: 143 mmol/L (ref 135–145)
TCO2: 23 mmol/L (ref 22–32)

## 2017-11-30 LAB — HEPARIN LEVEL (UNFRACTIONATED)

## 2017-11-30 LAB — CREATININE, URINE, RANDOM: Creatinine, Urine: 70.03 mg/dL

## 2017-11-30 LAB — PHOSPHORUS: PHOSPHORUS: 3.7 mg/dL (ref 2.5–4.6)

## 2017-11-30 LAB — MAGNESIUM: MAGNESIUM: 1.7 mg/dL (ref 1.7–2.4)

## 2017-11-30 LAB — TSH: TSH: 0.569 u[IU]/mL (ref 0.350–4.500)

## 2017-11-30 LAB — HEMOGLOBIN A1C
HEMOGLOBIN A1C: 6.3 % — AB (ref 4.8–5.6)
Mean Plasma Glucose: 134.11 mg/dL

## 2017-11-30 LAB — SODIUM, URINE, RANDOM: SODIUM UR: 64 mmol/L

## 2017-11-30 MED ORDER — INSULIN ASPART 100 UNIT/ML ~~LOC~~ SOLN
0.0000 [IU] | SUBCUTANEOUS | Status: DC
  Administered 2017-11-30: 5 [IU] via SUBCUTANEOUS
  Administered 2017-11-30 – 2017-12-01 (×4): 2 [IU] via SUBCUTANEOUS
  Administered 2017-12-02: 3 [IU] via SUBCUTANEOUS
  Administered 2017-12-02: 2 [IU] via SUBCUTANEOUS
  Filled 2017-11-30 (×2): qty 1

## 2017-11-30 MED ORDER — ACETAMINOPHEN 325 MG PO TABS
650.0000 mg | ORAL_TABLET | Freq: Four times a day (QID) | ORAL | Status: DC | PRN
  Administered 2017-12-02: 650 mg via ORAL
  Filled 2017-11-30: qty 2

## 2017-11-30 MED ORDER — DEXTROSE 50 % IV SOLN
INTRAVENOUS | Status: AC | PRN
  Administered 2017-11-30: 1 via INTRAVENOUS

## 2017-11-30 MED ORDER — DEXTROSE 50 % IV SOLN
1.0000 | Freq: Once | INTRAVENOUS | Status: AC
  Administered 2017-11-30: 50 mL via INTRAVENOUS

## 2017-11-30 MED ORDER — TECHNETIUM TC 99M DIETHYLENETRIAME-PENTAACETIC ACID
30.0000 | Freq: Once | INTRAVENOUS | Status: AC | PRN
  Administered 2017-11-30: 30 via RESPIRATORY_TRACT

## 2017-11-30 MED ORDER — METOPROLOL TARTRATE 50 MG PO TABS
50.0000 mg | ORAL_TABLET | Freq: Two times a day (BID) | ORAL | Status: DC
  Administered 2017-11-30 – 2017-12-02 (×6): 50 mg via ORAL
  Filled 2017-11-30 (×3): qty 1
  Filled 2017-11-30: qty 2
  Filled 2017-11-30: qty 1
  Filled 2017-11-30: qty 2

## 2017-11-30 MED ORDER — ACETAMINOPHEN 650 MG RE SUPP: 650.0000 mg | Freq: Four times a day (QID) | RECTAL | Status: DC | PRN

## 2017-11-30 MED ORDER — HYDROCODONE-ACETAMINOPHEN 5-325 MG PO TABS: 1.0000 | ORAL_TABLET | ORAL | Status: DC | PRN

## 2017-11-30 MED ORDER — ONDANSETRON HCL 4 MG/2ML IJ SOLN: 4.0000 mg | Freq: Four times a day (QID) | INTRAMUSCULAR | Status: DC | PRN

## 2017-11-30 MED ORDER — ALLOPURINOL 300 MG PO TABS
300.0000 mg | ORAL_TABLET | Freq: Every day | ORAL | Status: DC
  Administered 2017-12-01 – 2017-12-02 (×2): 300 mg via ORAL
  Filled 2017-11-30 (×4): qty 1

## 2017-11-30 MED ORDER — ALBUTEROL SULFATE (2.5 MG/3ML) 0.083% IN NEBU
10.0000 mg | INHALATION_SOLUTION | Freq: Once | RESPIRATORY_TRACT | Status: AC
  Administered 2017-11-30: 10 mg via RESPIRATORY_TRACT
  Filled 2017-11-30: qty 12

## 2017-11-30 MED ORDER — SODIUM CHLORIDE 0.9 % IV SOLN
INTRAVENOUS | Status: AC
  Administered 2017-11-30: 04:00:00 via INTRAVENOUS

## 2017-11-30 MED ORDER — HEPARIN BOLUS VIA INFUSION
4000.0000 [IU] | Freq: Once | INTRAVENOUS | Status: DC
  Filled 2017-11-30: qty 4000

## 2017-11-30 MED ORDER — DEXTROSE 50 % IV SOLN
1.0000 | Freq: Once | INTRAVENOUS | Status: AC
  Administered 2017-11-30: 50 mL via INTRAVENOUS
  Filled 2017-11-30: qty 50

## 2017-11-30 MED ORDER — ONDANSETRON HCL 4 MG PO TABS: 4.0000 mg | ORAL_TABLET | Freq: Four times a day (QID) | ORAL | Status: DC | PRN

## 2017-11-30 MED ORDER — ENOXAPARIN SODIUM 40 MG/0.4ML ~~LOC~~ SOLN
40.0000 mg | Freq: Every day | SUBCUTANEOUS | Status: DC
  Administered 2017-11-30 – 2017-12-02 (×3): 40 mg via SUBCUTANEOUS
  Filled 2017-11-30 (×4): qty 0.4

## 2017-11-30 MED ORDER — PANTOPRAZOLE SODIUM 40 MG PO TBEC
40.0000 mg | DELAYED_RELEASE_TABLET | Freq: Every day | ORAL | Status: DC
  Administered 2017-11-30 – 2017-12-02 (×3): 40 mg via ORAL
  Filled 2017-11-30 (×3): qty 1

## 2017-11-30 MED ORDER — SODIUM CHLORIDE 0.9 % IV SOLN
1.0000 g | INTRAVENOUS | Status: DC
  Administered 2017-11-30 – 2017-12-02 (×3): 1 g via INTRAVENOUS
  Filled 2017-11-30 (×3): qty 10

## 2017-11-30 MED ORDER — SODIUM CHLORIDE 0.9 % IV BOLUS
1000.0000 mL | Freq: Once | INTRAVENOUS | Status: AC
  Administered 2017-11-30: 1000 mL via INTRAVENOUS

## 2017-11-30 MED ORDER — ASPIRIN EC 81 MG PO TBEC
81.0000 mg | DELAYED_RELEASE_TABLET | Freq: Every day | ORAL | Status: DC
  Administered 2017-11-30 – 2017-12-02 (×3): 81 mg via ORAL
  Filled 2017-11-30 (×3): qty 1

## 2017-11-30 MED ORDER — INSULIN GLARGINE 100 UNIT/ML ~~LOC~~ SOLN
10.0000 [IU] | Freq: Every day | SUBCUTANEOUS | Status: DC
  Filled 2017-11-30: qty 0.1

## 2017-11-30 MED ORDER — HEPARIN (PORCINE) IN NACL 100-0.45 UNIT/ML-% IJ SOLN
1000.0000 [IU]/h | INTRAMUSCULAR | Status: DC
  Filled 2017-11-30: qty 250

## 2017-11-30 MED ORDER — DEXTROSE 50 % IV SOLN
INTRAVENOUS | Status: AC
  Filled 2017-11-30: qty 50

## 2017-11-30 MED ORDER — INSULIN ASPART 100 UNIT/ML IV SOLN
5.0000 [IU] | Freq: Once | INTRAVENOUS | Status: AC
  Administered 2017-11-30: 5 [IU] via INTRAVENOUS
  Filled 2017-11-30: qty 0.05

## 2017-11-30 NOTE — ED Notes (Signed)
POC CBG 117 

## 2017-11-30 NOTE — ED Notes (Signed)
Carb mod breakfast ordered

## 2017-11-30 NOTE — ED Notes (Signed)
Report given to 4E RN.  

## 2017-11-30 NOTE — ED Notes (Signed)
Admitting paged to RN per her request 

## 2017-11-30 NOTE — ED Notes (Signed)
Per admitting MD release metoprolol order to give stat

## 2017-11-30 NOTE — ED Notes (Signed)
Pt ate some crackers and peanut butter and drank 2 orange juices. Waiting on her meal tray, ordered soft foods @ 1115 for pt per pt request, states she is unable to eat her breakfast tray or sand which available in ED and she needs softer foods.

## 2017-11-30 NOTE — ED Notes (Signed)
After administration of 1 amp d50 pt woke up, oriented but lethargic.

## 2017-11-30 NOTE — ED Notes (Signed)
Pt soft food meal tray has arrived. Pt eating softened meat and peas. Pt states this is her first meal in a while that she has eaten.

## 2017-11-30 NOTE — ED Notes (Signed)
Pt had witnessed diaphoresis with seizure like activity lasting several minutes. 1 amp of d50 given, rapid response at bedside.

## 2017-11-30 NOTE — Plan of Care (Signed)
Care plan reviewed and patient is progressing.  

## 2017-11-30 NOTE — Progress Notes (Signed)
PROGRESS NOTE    Nicole CrutchLucille D Edwards  WUJ:[id number] DOB: 1940/08/24 DOA: 11/29/2017 PCP: [name], Teresa, FNP  Brief [name] NeedsNarrative77 y.o. female with medical history significant of  DM2 recent right ankle FRX, HLD, HTN, GERD    Presented with abnormal labs potassium 7.0 patient has been reporting some generalized fatigue.  Decreased p.o. intake recently her potassium supplementation has been doubled. Recently undergone right ankle repair status post fracture has been discharged to SNF.  Reports no  disuria but some chills. No SOB no chest pain.  Reports diarrhea and occasional vomiting x2 . States she has been on stool softeners.  Regarding pertinent Chronic problems: History of diabetes mellitus   While in ER: Confirm hyperkalemia and started on albuterol and bicarb insulin and dextrose after administration of albuterol became tachycardic   Assessment & Plan:   Active Problems:   Hypertension   GERD (gastroesophageal reflux disease)   Hyperlipidemia   DM (diabetes mellitus), type 2, uncontrolled with complications (HCC)   AKI (acute kidney injury) (HCC)   Dehydration   Hyperkalemia   Bacteria in urine  1]hyperkalemia-iatrogenic secondary to ace and k supplements.level down to 4.1.  Status post treatment with D50 and insulin sodium bicarb and albuterol.  2]AKI -creatinine improved with ivf.  3]htn continue home medications.  4]dm-patient was hypoglycemic in the ER.  Was given multiple doses of D50.  Lantus will be discontinued for now restart as needed.  Her appetite has been so low that she has not been eating much in the nursing home.  5] right ankle fracture followed by Ortho  6] bacteria in the urine patient was started on Rocephin in the ER.  Follow-up urine culture.  7] seizure per ER nurse patient had an episode of seizure while she was in the ER lasting for 1 minute blood sugar was not checked prior to giving D50.  However after giving D50 her seizure stopped.  If  seizure recurs consider consulting neurology.  He has no history of seizures.   DVT prophylaxis Lovenox Code Status: Full code Family Communication: No family available Disposition Plan patient admitted from [facility] will return to kept in place. Consultants: None  Procedures: None Antimicrobials Rocephin Subjective: Somewhat tearful about how her health test turned out.  Very frustrated with a recent right ankle fracture.  Objective: Resting in bed in no acute distress Vitals:   11/30/17 1330 11/30/17 1345 11/30/17 1400 11/30/17 1432  BP: (!) 101/48 (!) 128/56 (!) 120/48 (!) 136/53  Pulse: 81 92 96 (!) 101  Resp: 14 15 18 19   Temp:    97.6 F (36.4 C)  TempSrc:    Oral  SpO2: 100% 100% 100% 100%  Weight:      Height:        Intake/Output Summary (Last 24 hours) at 11/30/2017 1510 Last data filed at 11/30/2017 0421 Gross per 24 hour  Intake 1850 ml  Output 50 ml  Net 1800 ml   Filed Weights   11/29/17 1634  Weight: 81.6 kg (180 lb)    Examination:  General exam: Appears calm and comfortable  Respiratory system: Clear to auscultation. Respiratory effort normal. Cardiovascular system: S1 & S2 heard, RRR. No JVD, murmurs, rubs, gallops or clicks. No pedal edema. Gastrointestinal system: Abdomen is distended, soft and nontender. No organomegaly or masses felt. Normal bowel sounds heard. Central nervous system: Alert and oriented. No focal neurological deficits. Extremities: Symmetric 5 x 5 power. Skin: No rashes, lesions or ulcers Psychiatry: Judgement and insight appear normal.  Mood & affect appropriate.     Data Reviewed: I have personally reviewed following labs and imaging studies  CBC: Recent Labs  Lab 11/29/17 1631 11/29/17 2150 11/30/17 0344 11/30/17 1025  WBC 6.3  --  5.9  --   NEUTROABS 4.0  --   --   --   HGB 12.0 11.2* 9.6* 10.2*  HCT 38.4 33.0* 31.3* 30.0*  MCV 100.5*  --  100.0  --   PLT 211  --  173  --    Basic Metabolic Panel: Recent  Labs  Lab 11/29/17 1631 11/29/17 2150 11/30/17 0344 11/30/17 1025  NA 138 140 138 143  K 6.9* 5.4* 6.2* 4.1  CL 107 108 111 113*  CO2 21*  --  19*  --   GLUCOSE 196* 220* 168* 87  BUN 44* 40* 38* 29*  CREATININE 1.91* 1.60* 1.56* 1.10*  CALCIUM 9.2  --  8.2*  --   MG  --   --  1.7  --   PHOS  --   --  3.7  --    GFR: Estimated Creatinine Clearance: 45.2 mL/min (A) (by C-G formula based on SCr of 1.1 mg/dL (H)). Liver Function Tests: Recent Labs  Lab 11/29/17 1631 11/30/17 0344  AST 27 23  ALT 15 10*  ALKPHOS 79 63  BILITOT 0.5 0.5  PROT 6.7 5.2*  ALBUMIN 3.3* 2.5*   No results for input(s): LIPASE, AMYLASE in the last 168 hours. No results for input(s): AMMONIA in the last 168 hours. Coagulation Profile: No results for input(s): INR, PROTIME in the last 168 hours. Cardiac Enzymes: Recent Labs  Lab 11/30/17 0344 11/30/17 0834  TROPONINI <0.03 <0.03   BNP (last 3 results) No results for input(s): PROBNP in the last 8760 hours. HbA1C: Recent Labs    11/30/17 0344  HGBA1C 6.3*   CBG: Recent Labs  Lab 11/30/17 1056 11/30/17 1120 11/30/17 1144 11/30/17 1236 11/30/17 1345  GLUCAP 118* 77 67 120* 124*   Lipid Profile: No results for input(s): CHOL, HDL, LDLCALC, TRIG, CHOLHDL, LDLDIRECT in the last 72 hours. Thyroid Function Tests: Recent Labs    11/30/17 0344  TSH 0.569   Anemia Panel: No results for input(s): VITAMINB12, FOLATE, FERRITIN, TIBC, IRON, RETICCTPCT in the last 72 hours. Sepsis Labs: No results for input(s): PROCALCITON, LATICACIDVEN in the last 168 hours.  No results found for this or any previous visit (from the past 240 hour(s)).       Radiology Studies: Nm Pulmonary Perf And Vent  Result Date: 11/30/2017 CLINICAL DATA:  77 year old female status post recent surgery with weakness and shortness of breath. EXAM: NUCLEAR MEDICINE VENTILATION - PERFUSION LUNG SCAN TECHNIQUE: Ventilation images were obtained in multiple projections  using inhaled aerosol Tc-38m DTPA. Perfusion images were obtained in multiple projections after intravenous injection of Tc-85m-MAA. RADIOPHARMACEUTICALS:  32.0 mCi of Tc-81m DTPA aerosol inhalation and 4.2 mCi Tc24m-MAA IV COMPARISON:  Portable chest 11/29/2017. FINDINGS: Ventilation: Substantially heterogeneous bilateral ventilation radiotracer might be related to right greater than left central clumping of radiotracer particles. Perfusion: Lateral views were unable to be obtained as the patient could not raise her arms. No perfusion defects seen. IMPRESSION: No perfusion defect to suggest pulmonary embolus. Electronically Signed   By: Odessa Fleming M.D.   On: 11/30/2017 09:58   Dg Chest Portable 1 View  Result Date: 11/29/2017 CLINICAL DATA:  Shortness of breath EXAM: PORTABLE CHEST 1 VIEW COMPARISON:  01/16/2015 FINDINGS: Postsurgical changes in the proximal right humerus. Coarse  interstitial opacity consistent with chronic change. No acute consolidation or effusion. Normal heart size. Aortic atherosclerosis. No pneumothorax. IMPRESSION: No active disease.  Chronic appearing bronchitic changes. Electronically Signed   By: Jasmine Pang M.D.   On: 11/29/2017 20:06        Scheduled Meds: . allopurinol  300 mg Oral Daily  . aspirin EC  81 mg Oral Daily  . dextrose      . enoxaparin (LOVENOX) injection  40 mg Subcutaneous Daily  . insulin aspart  0-9 Units Subcutaneous Q4H  . metoprolol tartrate  50 mg Oral BID  . pantoprazole  40 mg Oral Daily   Continuous Infusions: . cefTRIAXone (ROCEPHIN) IVPB 1 gram/100 mL NS (Mini-Bag Plus) Stopped (11/30/17 0421)     LOS: 1 day        Alwyn Ren, MD  If 7PM-7AM, please contact night-coverage [email or web address] Password [email or web address] 11/30/2017, 3:10 PM

## 2017-11-30 NOTE — Progress Notes (Signed)
ANTICOAGULATION CONSULT NOTE - Initial Consult  Pharmacy Consult for Heparin  Indication: atrial fibrillation  Allergies  Allergen Reactions  . Liraglutide Nausea And Vomiting  . Niacin Nausea And Vomiting  . Atorvastatin Other (See Comments)    Myalgias  . Darvocet [Propoxyphene N-Acetaminophen] Nausea Only  . Metformin And Related Nausea Only  . Propoxyphene Nausea Only   Patient Measurements: Height: 5\' 5"  (165.1 cm) Weight: 180 lb (81.6 kg) IBW/kg (Calculated) : 57  Vital Signs: Temp: 98 F (36.7 C) (04/12 1634) Temp Source: Oral (04/12 1634) BP: 106/87 (04/12 2330) Pulse Rate: 123 (04/12 2330)  Labs: Recent Labs    11/29/17 1631 11/29/17 2150  HGB 12.0 11.2*  HCT 38.4 33.0*  PLT 211  --   CREATININE 1.91* 1.60*    Estimated Creatinine Clearance: 31.1 mL/min (A) (by C-G formula based on SCr of 1.6 mg/dL (H)).   Medical History: Past Medical History:  Diagnosis Date  . Arthritis   . Chest pain    Hospital 03/2012  // outpatient nuclear normal 04/02/2012  EF  80%  . Diabetes mellitus   . GERD (gastroesophageal reflux disease)   . Hyperlipidemia   . Hypertension   . Obesity   . Stress at home     Assessment: 77 y/o F from nursing facility with hyperkalemia/afib. Start heparin per pharmacy. Hgb 11.2. Plts ok. Noted renal dysfunction. Recent poor oral intake. PTA meds reviewed.   Goal of Therapy:  Heparin level 0.3-0.7 units/ml Monitor platelets by anticoagulation protocol: Yes   Plan:  Heparin 4000 units BOLUS Start heparin drip at 1000 units/hr 0900 HL Daily CBC/HL Monitor for bleeding   Abran DukeLedford, Deshondra Worst 11/30/2017,12:10 AM

## 2017-11-30 NOTE — Plan of Care (Signed)
  Problem: Education: Goal: Knowledge of General Education information will improve Outcome: Progressing   

## 2017-11-30 NOTE — ED Notes (Signed)
Pt placed on hospital bed for comfort.

## 2017-11-30 NOTE — ED Notes (Signed)
Carb modified lunch tray ordered 

## 2017-11-30 NOTE — ED Notes (Signed)
MD Ashley RoyaltyMatthews aware of pt rapid response.

## 2017-12-01 ENCOUNTER — Inpatient Hospital Stay (HOSPITAL_COMMUNITY): Payer: Medicare HMO

## 2017-12-01 DIAGNOSIS — E860 Dehydration: Secondary | ICD-10-CM

## 2017-12-01 DIAGNOSIS — K219 Gastro-esophageal reflux disease without esophagitis: Secondary | ICD-10-CM

## 2017-12-01 DIAGNOSIS — N179 Acute kidney failure, unspecified: Secondary | ICD-10-CM

## 2017-12-01 LAB — CBC WITH DIFFERENTIAL/PLATELET
BASOS PCT: 1 %
Basophils Absolute: 0 10*3/uL (ref 0.0–0.1)
EOS ABS: 0.3 10*3/uL (ref 0.0–0.7)
Eosinophils Relative: 5 %
HEMATOCRIT: 32.7 % — AB (ref 36.0–46.0)
HEMOGLOBIN: 10.2 g/dL — AB (ref 12.0–15.0)
Lymphocytes Relative: 33 %
Lymphs Abs: 1.9 10*3/uL (ref 0.7–4.0)
MCH: 32.1 pg (ref 26.0–34.0)
MCHC: 31.2 g/dL (ref 30.0–36.0)
MCV: 102.8 fL — ABNORMAL HIGH (ref 78.0–100.0)
Monocytes Absolute: 0.6 10*3/uL (ref 0.1–1.0)
Monocytes Relative: 11 %
NEUTROS ABS: 2.9 10*3/uL (ref 1.7–7.7)
NEUTROS PCT: 50 %
Platelets: 179 10*3/uL (ref 150–400)
RBC: 3.18 MIL/uL — ABNORMAL LOW (ref 3.87–5.11)
RDW: 17.4 % — ABNORMAL HIGH (ref 11.5–15.5)
WBC: 5.7 10*3/uL (ref 4.0–10.5)

## 2017-12-01 LAB — GLUCOSE, CAPILLARY
GLUCOSE-CAPILLARY: 105 mg/dL — AB (ref 65–99)
GLUCOSE-CAPILLARY: 116 mg/dL — AB (ref 65–99)
Glucose-Capillary: 100 mg/dL — ABNORMAL HIGH (ref 65–99)
Glucose-Capillary: 151 mg/dL — ABNORMAL HIGH (ref 65–99)
Glucose-Capillary: 186 mg/dL — ABNORMAL HIGH (ref 65–99)

## 2017-12-01 LAB — BASIC METABOLIC PANEL
ANION GAP: 8 (ref 5–15)
BUN: 20 mg/dL (ref 6–20)
CALCIUM: 8.5 mg/dL — AB (ref 8.9–10.3)
CO2: 22 mmol/L (ref 22–32)
CREATININE: 0.92 mg/dL (ref 0.44–1.00)
Chloride: 107 mmol/L (ref 101–111)
GFR calc non Af Amer: 59 mL/min — ABNORMAL LOW (ref >60)
Glucose, Bld: 95 mg/dL (ref 65–99)
Potassium: 5.1 mmol/L (ref 3.5–5.1)
Sodium: 137 mmol/L (ref 135–145)

## 2017-12-01 NOTE — Progress Notes (Signed)
[facility]                                                                              Patient Demographics  [name], is a 77 y.o. female, DOB - 1941-01-28, ZOX:[id number]  Admit date - 11/29/2017   Admitting Physician Therisa Doyne, MD  Outpatient Primary MD for the patient is Elizabeth Palau, FNP  Outpatient specialists:   LOS - 2  days   Medical records reviewed and are as summarized below:    Chief Complaint  Patient presents with  . Weakness       Brief summary   77 y.o.femalewith medical history significant of DM2 recent right ankle FRX, HLD, HTN, GERD presented withabnormal labs potassium 7.0 patient has been reporting some generalized fatigue.Decreased p.o. intake recently her potassium supplementation had been doubled. Recently undergone right ankle repair status post fracture has been discharged to SNF. Reports no disuria but some chills. No SOB no chest pain. Reports diarrhea and occasional vomiting x2 . States she has been on stool softeners.     Assessment & Plan    Hyperkalemia -Potassium 6.9 at the time of admission -Iatrogenic secondary to ACE inhibitor, potassium supplements  - Now improved with D50, insulin, sodium bicarb and albuterol in ED      GERD (gastroesophageal reflux disease) -Continue PPI  Hypertension Currently stable, continue metoprolol-     DM (diabetes mellitus), type 2, uncontrolled with complications (HCC), hypoglycemia -Patient was hypoglycemic in ED, with CBG in 40s  Seizure-like activity likely respiratory due to severe hypoglycemia -Patient had received insulin IV with D50 for hyperkalemia and had not been eating much in the nursing home due to poor appetite  - CBG was down to 48, precipitated seizure-like activity. -Patient received D50 with improvement in her symptoms    AKI (acute kidney injury) (HCC) with dehydration -Patient presented with creatinine of 1.9, baseline  0.8-0.9 - creatinine improved with IV fluid, creatinine 0.9  UTI -Continue IV Rocephin for now, follow urine culture and sensitivities  Right ankle fracture -Followed by orthopedics, PT OT evaluation, CIR consult placed -Continue pain control   Code Status: Full CODE STATUS DVT Prophylaxis:  Lovenox Family Communication: Discussed in detail with the patient, all imaging results, lab results explained to the patient    Disposition Plan: Possible DC to skilled nursing facility your CIR or home health in a.m.  Time Spent in minutes  Procedures:  none  Consultants:   None  Antimicrobials:      Medications  Scheduled Meds: . allopurinol  300 mg Oral Daily  . aspirin EC  81 mg Oral Daily  . enoxaparin (LOVENOX) injection  40 mg Subcutaneous Daily  . insulin aspart  0-9 Units Subcutaneous Q4H  . metoprolol tartrate  50 mg Oral BID  . pantoprazole  40 mg Oral Daily   Continuous Infusions: . cefTRIAXone (ROCEPHIN) IVPB 1 gram/100 mL NS (Mini-Bag Plus) Stopped (12/01/17 0420)   PRN Meds:.acetaminophen **OR** acetaminophen, HYDROcodone-acetaminophen, ondansetron **OR** ondansetron (ZOFRAN) IV   Antibiotics   Anti-infectives (From admission, onward)   Start     Dose/Rate Route Frequency Ordered Stop   11/30/17  0245  cefTRIAXone (ROCEPHIN) 1 g in sodium chloride 0.9 % 100 mL IVPB     1 g 200 mL/hr over 30 Minutes Intravenous Every 24 hours 11/30/17 0245          Subjective:   [name] was seen and examined today.  Denies any specific complaints.  Feeling a lot better. patient denies dizziness, chest pain, shortness of breath, abdominal pain, N/V/D/C, new weakness, numbess, tingling. No acute events overnight.    Objective:   Vitals:   11/30/17 2010 11/30/17 2353 12/01/17 0359 12/01/17 0839  BP: (!) 114/59 (!) 134/57 (!) 141/63 139/74  Pulse: (!) 106 (!) 103 90 99  Resp: 17 19 17 19   Temp: 98.6 F (37 C) 98.6 F (37 C) 98.1 F (36.7 C) 98 F  (36.7 C)  TempSrc: Oral Oral Oral Oral  SpO2: 100% 97% 97% 97%  Weight:      Height:        Intake/Output Summary (Last 24 hours) at 12/01/2017 1306 Last data filed at 12/01/2017 1234 Gross per 24 hour  Intake 822 ml  Output 900 ml  Net -78 ml     Wt Readings from Last 3 Encounters:  11/29/17 81.6 kg (180 lb)  10/28/17 81.6 kg (179 lb 14.3 oz)  04/02/16 91.4 kg (201 lb 9.6 oz)     Exam  General: Alert and oriented x 3, NAD  Eyes:   HEENT:  Atraumatic, normocephalic, normal oropharynx  Cardiovascular: S1 S2 auscultated, no rubs, murmurs or gallops. Regular rate and rhythm.  Respiratory: Clear to auscultation bilaterally, no wheezing, rales or rhonchi  Gastrointestinal: Soft, nontender, nondistended, + bowel sounds  Ext: no pedal edema bilaterally  Neuro: no new deficits  Musculoskeletal: No digital cyanosis, clubbing  Skin: No rashes  Psych: Normal affect and demeanor, alert and oriented x3    Data Reviewed:  I have personally reviewed following labs and imaging studies  Micro Results Recent Results (from the past 240 hour(s))  Urine Culture     Status: Abnormal (Preliminary result)   Collection Time: 11/29/17  9:00 PM  Result Value Ref Range Status   Specimen Description URINE, CLEAN CATCH  Final   Special Requests   Final    NONE Performed at [facility], [address] [place], [place] [address]    Culture >=100,000 COLONIES/mL GRAM NEGATIVE RODS (A)  Final   Report Status PENDING  Incomplete    Radiology Reports Nm Pulmonary Perf And Vent  Result Date: 11/30/2017 CLINICAL DATA:  77 year old female status post recent surgery with weakness and shortness of breath. EXAM: NUCLEAR MEDICINE VENTILATION - PERFUSION LUNG SCAN TECHNIQUE: Ventilation images were obtained in multiple projections using inhaled aerosol Tc-6178m DTPA. Perfusion images were obtained in multiple projections after intravenous injection of Tc-3078m-MAA. RADIOPHARMACEUTICALS:   32.0 mCi of Tc-7478m DTPA aerosol inhalation and 4.2 mCi Tc2078m-MAA IV COMPARISON:  Portable chest 11/29/2017. FINDINGS: Ventilation: Substantially heterogeneous bilateral ventilation radiotracer might be related to right greater than left central clumping of radiotracer particles. Perfusion: Lateral views were unable to be obtained as the patient could not raise her arms. No perfusion defects seen. IMPRESSION: No perfusion defect to suggest pulmonary embolus. Electronically Signed   By: Odessa FlemingH  Hall M.D.   On: 11/30/2017 09:58   Dg Chest Portable 1 View  Result Date: 11/29/2017 CLINICAL DATA:  Shortness of breath EXAM: PORTABLE CHEST 1 VIEW COMPARISON:  01/16/2015 FINDINGS: Postsurgical changes in the proximal right humerus. Coarse interstitial opacity consistent with chronic change.  No acute consolidation or effusion. Normal heart size. Aortic atherosclerosis. No pneumothorax. IMPRESSION: No active disease.  Chronic appearing bronchitic changes. Electronically Signed   By: Jasmine Pang M.D.   On: 11/29/2017 20:06    Lab Data:  CBC: Recent Labs  Lab 11/29/17 1631 11/29/17 2150 11/30/17 0344 11/30/17 1025 12/01/17 0427  WBC 6.3  --  5.9  --  5.7  NEUTROABS 4.0  --   --   --  2.9  HGB 12.0 11.2* 9.6* 10.2* 10.2*  HCT 38.4 33.0* 31.3* 30.0* 32.7*  MCV 100.5*  --  100.0  --  102.8*  PLT 211  --  173  --  179   Basic Metabolic Panel: Recent Labs  Lab 11/29/17 1631 11/29/17 2150 11/30/17 0344 11/30/17 1025 12/01/17 0427  NA 138 140 138 143 137  K 6.9* 5.4* 6.2* 4.1 5.1  CL 107 108 111 113* 107  CO2 21*  --  19*  --  22  GLUCOSE 196* 220* 168* 87 95  BUN 44* 40* 38* 29* 20  CREATININE 1.91* 1.60* 1.56* 1.10* 0.92  CALCIUM 9.2  --  8.2*  --  8.5*  MG  --   --  1.7  --   --   PHOS  --   --  3.7  --   --    GFR: Estimated Creatinine Clearance: 54 mL/min (by C-G formula based on SCr of 0.92 mg/dL). Liver Function Tests: Recent Labs  Lab 11/29/17 1631 11/30/17 0344  AST 27 23  ALT 15  10*  ALKPHOS 79 63  BILITOT 0.5 0.5  PROT 6.7 5.2*  ALBUMIN 3.3* 2.5*   No results for input(s): LIPASE, AMYLASE in the last 168 hours. No results for input(s): AMMONIA in the last 168 hours. Coagulation Profile: No results for input(s): INR, PROTIME in the last 168 hours. Cardiac Enzymes: Recent Labs  Lab 11/30/17 0344 11/30/17 0834 11/30/17 1539  TROPONINI <0.03 <0.03 <0.03   BNP (last 3 results) No results for input(s): PROBNP in the last 8760 hours. HbA1C: Recent Labs    11/30/17 0344  HGBA1C 6.3*   CBG: Recent Labs  Lab 11/30/17 2014 11/30/17 2354 12/01/17 0358 12/01/17 0838 12/01/17 1300  GLUCAP 201* 171* 105* 100* 151*   Lipid Profile: No results for input(s): CHOL, HDL, LDLCALC, TRIG, CHOLHDL, LDLDIRECT in the last 72 hours. Thyroid Function Tests: Recent Labs    11/30/17 0344  TSH 0.569   Anemia Panel: No results for input(s): VITAMINB12, FOLATE, FERRITIN, TIBC, IRON, RETICCTPCT in the last 72 hours. Urine analysis:    Component Value Date/Time   COLORURINE YELLOW 11/29/2017 2100   APPEARANCEUR CLEAR 11/29/2017 2100   LABSPEC 1.012 11/29/2017 2100   PHURINE 5.0 11/29/2017 2100   GLUCOSEU NEGATIVE 11/29/2017 2100   HGBUR NEGATIVE 11/29/2017 2100   BILIRUBINUR NEGATIVE 11/29/2017 2100   KETONESUR NEGATIVE 11/29/2017 2100   PROTEINUR NEGATIVE 11/29/2017 2100   UROBILINOGEN 0.2 01/17/2015 0015   NITRITE NEGATIVE 11/29/2017 2100   LEUKOCYTESUR TRACE (A) 11/29/2017 2100     Avanthika Dehnert M.D. [facility] 12/01/2017, 1:06 PM  Pager: [phone] Between 7am to 7pm - call Pager - [phone]  After 7pm go to [email or web address] - password [email or web address]  Call night coverage person covering after 7pm

## 2017-12-01 NOTE — Plan of Care (Signed)
  Problem: Education: Goal: Knowledge of General Education information will improve Outcome: Progressing   Problem: Clinical Measurements: Goal: Diagnostic test results will improve Outcome: Progressing   Problem: Nutrition: Goal: Adequate nutrition will be maintained Outcome: Progressing

## 2017-12-01 NOTE — Social Work (Addendum)
CSW acknowledging consult for SNF placement, [facility]. Aware that pt daughter desires CIR/[facility].   CSW will leave hand off for floor CSW to follow.  Doy HutchingIsabel H Johntavius Shepard, LCSWA [facility] Clinical Social Work [phone]

## 2017-12-01 NOTE — Plan of Care (Signed)
Care plan reviewed, patient is progressing.  

## 2017-12-01 NOTE — Evaluation (Signed)
Occupational Therapy Evaluation Patient Details Name: Nicole CrutchLucille D Edwards MRN: [id number] DOB: 01-Dec-1940 Today's Date: 12/01/2017    History of Present Illness 77 y.o. female with medical history significant of  DM2, recent right ankle fracture, HLD, HTN, GERD presented from SNF with potassium 7.0, hx generalized fatigue.     Clinical Impression   Pt had been participating in rehabilitation at SNF level prior to admission. Pt currently requiring min assist for lateral scoot simulated toilet transfers, max assist for LB ADL, and min assist for UB ADL. Pt motivated to participate with OT and reports she would prefer not to return to SNF if possible. Her daughter is currently measuring doorways at home to see if it is wheelchair accessible. From OT perspective, feel that pt has great rehabilitation potential and would benefit from CIR level therapies to maximize functional independence prior to returning home with assistance from her daughter. OT will continue to follow while admitted.    Follow Up Recommendations  CIR;Supervision/Assistance - 24 hour    Equipment Recommendations  3 in 1 bedside commode;Tub/shower bench;Wheelchair (measurements OT);Wheelchair cushion (measurements OT)(drop-arm BSC)    Recommendations for Other Services Rehab consult     Precautions / Restrictions Precautions Required Braces or Orthoses: Other Brace/Splint(CAM walker on RLE) Restrictions Weight Bearing Restrictions: Yes RLE Weight Bearing: Non weight bearing Other Position/Activity Restrictions: Per previous admission, R LE NWB      Mobility Bed Mobility Overal bed mobility: Modified Independent                Transfers Overall transfer level: Needs assistance   Transfers: Lateral/Scoot Transfers          Lateral/Scoot Transfers: Min assist General transfer comment: Able to complete multiple lateral scoot transfers from bed to drop-arm recliner.     Balance Overall balance assessment:  Needs assistance Sitting-balance support: No upper extremity supported;Feet supported Sitting balance-Leahy Scale: Good                                     ADL either performed or assessed with clinical judgement   ADL Overall ADL's : Needs assistance/impaired Eating/Feeding: Set up;Sitting   Grooming: Supervision/safety;Sitting   Upper Body Bathing: Minimal assistance;Sitting   Lower Body Bathing: Maximal assistance;Sitting/lateral leans   Upper Body Dressing : Minimal assistance;Sitting   Lower Body Dressing: Maximal assistance;Sitting/lateral leans   Toilet Transfer: Minimal assistance Toilet Transfer Details (indicate cue type and reason): lateral scoot transfer from bed to drop-arm recliner Toileting- Clothing Manipulation and Hygiene: Maximal assistance;Sitting/lateral lean         General ADL Comments: Pt motivated to participate with OT. Remains limited by RLE NWB status but lateral scoot would be an option with drop-arm BSC.      Vision Patient Visual Report: No change from baseline Vision Assessment?: No apparent visual deficits     Perception     Praxis      Pertinent Vitals/Pain Pain Assessment: No/denies pain     Hand Dominance Right   Extremity/Trunk Assessment Upper Extremity Assessment Upper Extremity Assessment: Overall WFL for tasks assessed   Lower Extremity Assessment Lower Extremity Assessment: Defer to PT evaluation       Communication Communication Communication: No difficulties   Cognition Arousal/Alertness: Awake/alert Behavior During Therapy: WFL for tasks assessed/performed Overall Cognitive Status: Within Functional Limits for tasks assessed  General Comments       Exercises     Shoulder Instructions      Home Living Family/patient expects to be discharged to:: Private residence Living Arrangements: Children Available Help at Discharge: Family;Available  24 hours/day Type of Home: Mobile home Home Access: Ramped entrance     Home Layout: One level     Bathroom Shower/Tub: Producer, television/film/video: Standard Bathroom Accessibility: No   Home Equipment: Environmental consultant - 4 wheels;Bedside commode;Hand held shower head   Additional Comments: Pt wants to go home with daughter's assistance. Daughter measuring to see if w/c will work at home.       Prior Functioning/Environment Level of Independence: Needs assistance  Gait / Transfers Assistance Needed: was min assist to supervision for slide board transfers bed>w/c at SNF, unable to stand or walk while NWB on RLE ADL's / Homemaking Assistance Needed: assistance from SNF staff and working with OT on improving independence.    Comments: will be allowed to bear weight on RLE after 4/22 MD follow up        OT Problem List: Decreased strength;Decreased range of motion;Decreased activity tolerance;Impaired balance (sitting and/or standing);Decreased safety awareness;Decreased knowledge of use of DME or AE;Decreased knowledge of precautions;Pain      OT Treatment/Interventions: Self-care/ADL training;Therapeutic exercise;Energy conservation;DME and/or AE instruction;Therapeutic activities;Patient/family education;Balance training    OT Goals(Current goals can be found in the care plan section) Acute Rehab OT Goals Patient Stated Goal: get home, get back to walking OT Goal Formulation: With patient Time For Goal Achievement: 12/15/17 Potential to Achieve Goals: Good ADL Goals Pt Will Perform Grooming: with modified independence;sitting Pt Will Perform Lower Body Dressing: with modified independence;sit to/from stand Pt Will Transfer to Toilet: with modified independence;bedside commode(with slide board, drop-arm BSC) Pt Will Perform Toileting - Clothing Manipulation and hygiene: with modified independence;sitting/lateral leans Pt Will Perform Tub/Shower Transfer: with transfer board;shower  seat;with modified independence  OT Frequency: Min 2X/week   Barriers to D/C:            Co-evaluation              AM-PAC PT "6 Clicks" Daily Activity     Outcome Measure Help from another person eating meals?: None Help from another person taking care of personal grooming?: A Little Help from another person toileting, which includes using toliet, bedpan, or urinal?: A Lot Help from another person bathing (including washing, rinsing, drying)?: A Lot Help from another person to put on and taking off regular upper body clothing?: A Little Help from another person to put on and taking off regular lower body clothing?: A Lot 6 Click Score: 16   End of Session Equipment Utilized During Treatment: Gait belt Nurse Communication: Mobility status;Other (comment)(recommend lateral scoot back to bed; Pure whick off)  Activity Tolerance: Patient tolerated treatment well Patient left: in chair;with call bell/phone within reach;with chair alarm set  OT Visit Diagnosis: Other abnormalities of gait and mobility (R26.89);Pain Pain - Right/Left: Right Pain - part of body: Ankle and joints of foot                Time: 1427-1500 OT Time Calculation (min): 33 min Charges:  OT General Charges $OT Visit: 1 Visit OT Evaluation $OT Eval Moderate Complexity: 1 Mod OT Treatments $Self Care/Home Management : 8-22 mins G-Codes:     Doristine Section, MS OTR/L  Pager: [phone]   [name] A [name] 12/01/2017, 4:27 PM

## 2017-12-01 NOTE — Evaluation (Signed)
Physical Therapy Evaluation Patient Details Name: [name] MRN: [id number] DOB: [birth year] Today's Date: 12/01/2017   History of Present Illness   77 y.o.femalewith medical history significant of DM2, recent right ankle FRX, HLD, HTN, GERD presented from SNF with potassium 7.0, hx generalized fatigue.      Clinical Impression  Pt presents to PT with significant limitations to mobility due to weakness, weightbearing restrictions, complicated by multiple medical issues, and resulting in inability to walk, confined to w/c for locomotion.  Per pt, may be cleared to increase WB status following 4/22 MD appointment. Pt from SNF for ankle rehab but reports unsatisfactory experience and refuses to return.  Daughter up from [facility] to care for pt at home if able to go home at d/c.  Pt lives in mobile home so grave concern whether w/c will fit through doorways to allow full access to home.  Daughter will measure doors/hallways and report back.  IF w/c will fit, pt could d/c home with full [facility] services; if not, have asked CIR to screen for potential admission assuming daughter can return to assist at d/c.  Last desireable plan is to ask CSW to search for different SNF.  Clinically, feel pt would benefit from CIR level care, and with impending increase in WB status could begin gait training in full soon. PT will continue to follow acutely and assist with d/c as needed. See below for details of exam findings and care plan for goals of care.     Follow Up Recommendations Other (comment);Home health PT;CIR(could go home as desired if W/C fits through doors)    Equipment Recommendations  Rolling walker with 5" wheels;Wheelchair (measurements PT)    Recommendations for Other Services Rehab consult     Precautions / Restrictions Precautions Required Braces or Orthoses: Other Brace/Splint(cam walker on RLE) Restrictions Weight Bearing Restrictions: Yes RLE Weight Bearing: Non weight bearing LLE Weight  Bearing: Non weight bearing Other Position/Activity Restrictions: **Clarifiy R LE is affected side not left      Mobility  Bed Mobility Overal bed mobility: Modified Independent             General bed mobility comments: moves to/from EOB unassisted and is able to repostion self in bed with effort, though did ask for help with bed covers  Transfers Overall transfer level: Needs assistance Equipment used: Rolling walker (2 wheeled) Transfers: Sit to/from Stand Sit to Stand: Mod assist;From elevated surface         General transfer comment: with effort pt able to stand with RW and mod A at EOB. becomes shakey after 10-15 sec and needs to sit by 20-30 seconds.  Repeated x2, noting HR incr from 96-121 with that effort  Ambulation/Gait             General Gait Details: unable  Stairs            Wheelchair Mobility    Modified Rankin (Stroke Patients Only)       Balance Overall balance assessment: Needs assistance Sitting-balance support: No upper extremity supported;Feet supported Sitting balance-Leahy Scale: Good     Standing balance support: Bilateral upper extremity supported;During functional activity Standing balance-Leahy Scale: Zero Standing balance comment: requires RW to stand while NWB and recovering from ankle fx                             Pertinent Vitals/Pain Pain Assessment: No/denies pain    Home Living Family/patient  expects to be discharged to:: Private residence Living Arrangements: Children Available Help at Discharge: Family;Available 24 hours/day Type of Home: Mobile home Home Access: Ramped entrance     Home Layout: One level Home Equipment: Walker - 4 wheels;Bedside commode;Hand held shower head Additional Comments: [facility] "I [facility] that place" wants to go home with daughter's help.  daughter willing but pt lives in mobile home and is at w/c level and not sure if w/c will fit through thresholds.   daughter will measure and report back    Prior Function Level of Independence: Needs assistance   Gait / Transfers Assistance Needed: was min assist to supervision for slide board transfers bed>w/c at SNF, unable to stand or walk while NWB on RLE     Comments: will be allowed to bear weight on RLE after 4/22 MD follow up     Hand Dominance   Dominant Hand: Right    Extremity/Trunk Assessment   Upper Extremity Assessment Upper Extremity Assessment: Defer to OT evaluation;Generalized weakness    Lower Extremity Assessment Lower Extremity Assessment: RLE deficits/detail RLE Deficits / Details: NWB in cam walker.  but able to move leg on/off bed and stand with RW and NBW/TDWB for max 20 sec (see mobility below) RLE Sensation: WNL RLE Coordination: WNL       Communication   Communication: No difficulties  Cognition Arousal/Alertness: Awake/alert Behavior During Therapy: WFL for tasks assessed/performed Overall Cognitive Status: Within Functional Limits for tasks assessed                                        General Comments General comments (skin integrity, edema, etc.): did not assess slide board transfer, but pt demonstrates adequate bed mobility skills to indicate competence with task    Exercises     Assessment/Plan    PT Assessment Patient needs continued PT services  PT Problem List Decreased strength;Decreased range of motion;Decreased activity tolerance;Decreased mobility;Decreased balance;Decreased knowledge of use of DME;Cardiopulmonary status limiting activity       PT Treatment Interventions DME instruction;Gait training;Functional mobility training;Therapeutic activities;Therapeutic exercise;Patient/family education;Wheelchair mobility training    PT Goals (Current goals can be found in the Care Plan section)  Acute Rehab PT Goals Patient Stated Goal: get home, get back to walking PT Goal Formulation: With patient/family Time For Goal  Achievement: 12/15/17 Potential to Achieve Goals: Good    Frequency Min 3X/week   Barriers to discharge Inaccessible home environment unsure if w/c will fit through thresholds in mobile home, daughter will provide measurements to compare    Co-evaluation               AM-PAC PT "6 Clicks" Daily Activity  Outcome Measure Difficulty turning over in bed (including adjusting bedclothes, sheets and blankets)?: A Little Difficulty moving from lying on back to sitting on the side of the bed? : A Little Difficulty sitting down on and standing up from a chair with arms (e.g., wheelchair, bedside commode, etc,.)?: A Lot Help needed moving to and from a bed to chair (including a wheelchair)?: A Little Help needed walking in hospital room?: Total Help needed climbing 3-5 steps with a railing? : Total 6 Click Score: 13    End of Session Equipment Utilized During Treatment: Gait belt Activity Tolerance: No increased pain;Patient tolerated treatment well Patient left: in bed;with call bell/phone within reach Nurse Communication: Mobility status;Precautions;Weight  bearing status PT Visit Diagnosis: History of falling (Z91.81);Other abnormalities of gait and mobility (R26.89)    Time: 1610-9604 PT Time Calculation (min) (ACUTE ONLY): 37 min   Charges:   PT Evaluation $PT Eval Moderate Complexity: 1 Mod PT Treatments $Therapeutic Activity: 23-37 mins   PT G Codes:        Narda Amber, PT, DPT, MS Board Certified Geriatric Clinical Specialist  Dennis Bast 12/01/2017, 11:59 AM

## 2017-12-02 ENCOUNTER — Other Ambulatory Visit (HOSPITAL_COMMUNITY): Payer: Medicare HMO

## 2017-12-02 DIAGNOSIS — E1165 Type 2 diabetes mellitus with hyperglycemia: Secondary | ICD-10-CM

## 2017-12-02 DIAGNOSIS — S82891A Other fracture of right lower leg, initial encounter for closed fracture: Secondary | ICD-10-CM

## 2017-12-02 DIAGNOSIS — R8271 Bacteriuria: Secondary | ICD-10-CM

## 2017-12-02 DIAGNOSIS — R0989 Other specified symptoms and signs involving the circulatory and respiratory systems: Secondary | ICD-10-CM

## 2017-12-02 DIAGNOSIS — D62 Acute posthemorrhagic anemia: Secondary | ICD-10-CM

## 2017-12-02 DIAGNOSIS — I10 Essential (primary) hypertension: Secondary | ICD-10-CM

## 2017-12-02 DIAGNOSIS — E118 Type 2 diabetes mellitus with unspecified complications: Secondary | ICD-10-CM

## 2017-12-02 DIAGNOSIS — S82891S Other fracture of right lower leg, sequela: Secondary | ICD-10-CM

## 2017-12-02 LAB — URINE CULTURE

## 2017-12-02 LAB — GLUCOSE, CAPILLARY
GLUCOSE-CAPILLARY: 153 mg/dL — AB (ref 65–99)
GLUCOSE-CAPILLARY: 98 mg/dL (ref 65–99)
Glucose-Capillary: 140 mg/dL — ABNORMAL HIGH (ref 65–99)
Glucose-Capillary: 206 mg/dL — ABNORMAL HIGH (ref 65–99)

## 2017-12-02 LAB — BASIC METABOLIC PANEL
ANION GAP: 9 (ref 5–15)
BUN: 18 mg/dL (ref 6–20)
CALCIUM: 8.5 mg/dL — AB (ref 8.9–10.3)
CO2: 23 mmol/L (ref 22–32)
Chloride: 106 mmol/L (ref 101–111)
Creatinine, Ser: 0.83 mg/dL (ref 0.44–1.00)
GFR calc Af Amer: >60 mL/min (ref >60)
GFR calc non Af Amer: >60 mL/min (ref >60)
GLUCOSE: 127 mg/dL — AB (ref 65–99)
Potassium: 4.8 mmol/L (ref 3.5–5.1)
Sodium: 138 mmol/L (ref 135–145)

## 2017-12-02 LAB — CBC
HEMATOCRIT: 32.4 % — AB (ref 36.0–46.0)
HEMOGLOBIN: 10.1 g/dL — AB (ref 12.0–15.0)
MCH: 32 pg (ref 26.0–34.0)
MCHC: 31.2 g/dL (ref 30.0–36.0)
MCV: 102.5 fL — AB (ref 78.0–100.0)
Platelets: 185 10*3/uL (ref 150–400)
RBC: 3.16 MIL/uL — ABNORMAL LOW (ref 3.87–5.11)
RDW: 16.8 % — AB (ref 11.5–15.5)
WBC: 5.8 10*3/uL (ref 4.0–10.5)

## 2017-12-02 MED ORDER — INSULIN GLARGINE 100 UNIT/ML SOLOSTAR PEN: 5.0000 [IU] | PEN_INJECTOR | Freq: Every day | SUBCUTANEOUS | 11 refills | Status: AC

## 2017-12-02 MED ORDER — CEPHALEXIN 500 MG PO CAPS: 500.0000 mg | ORAL_CAPSULE | Freq: Two times a day (BID) | ORAL | 0 refills | Status: DC

## 2017-12-02 MED ORDER — HYDROCODONE-ACETAMINOPHEN 5-325 MG PO TABS: 1.0000 | ORAL_TABLET | Freq: Four times a day (QID) | ORAL | 0 refills | Status: DC | PRN

## 2017-12-02 MED ORDER — INSULIN ASPART 100 UNIT/ML ~~LOC~~ SOLN: SUBCUTANEOUS | 3 refills | Status: AC

## 2017-12-02 MED ORDER — METOPROLOL TARTRATE 50 MG PO TABS: 50.0000 mg | ORAL_TABLET | Freq: Two times a day (BID) | ORAL | Status: AC

## 2017-12-02 NOTE — Progress Notes (Signed)
Inpatient Rehabilitation  Please see consult by Dr. Allena KatzPatel for full details; recommend home with home health if assist available versus SNF.  Note CSW working on placement.  Notified nurse case Production designer, theatre/television/filmmanager.  Will sign off at this time.    Charlane FerrettiMelissa Audric Venn, M.A., CCC/SLP Admission Coordinator  [facility]  Cell [phone]

## 2017-12-02 NOTE — Progress Notes (Signed)
PT Cancellation Note  Patient Details Name: Nicole CrutchLucille D Beadnell MRN: [id number] DOB: 27-Mar-1941   Cancelled Treatment:    Reason Eval/Treat Not Completed: Other (comment) Per chart review, patient is now discharging to SNF [facility] today; plan to hold PT today due to transfer to skilled nursing facility this afternoon.    Nedra HaiKristen Unger PT, DPT, CBIS  Supplemental Physical Therapist [facility]   Pager [phone]

## 2017-12-02 NOTE — Consult Note (Signed)
Physical Medicine and Rehabilitation Consult Reason for Consult: Decreased functional mobility Referring Physician: Triad   HPI: [name] is a 77 y.o. right-handed female with history of hypertension, diabetes mellitus and recent right ankle displaced trimalleolar fracture with ORIF 10/27/2017 and discharged to [facility] skilled nursing facility 10/31/2017.  History taken from chart review and patient. Prior to her ankle fracture she lives alone was independent and driving.  She used an occasional walking stick.  She has a daughter in [place] question assistance on discharge.  Presented 11/29/2017 with hyperkalemia 7.0, CBG 48 creatinine 1.91 from baseline 0.85, tachycardia and noted generalized fatigue.  Her lisinopril was discontinued.  Pulmonary perfusion scan showed no signs of pulmonary emboli.  Troponin negative.  Chest x-ray negative.  Hyperkalemia corrected placed on low-dose IV fluids.  She did also receive D50 for her hypokalemia.  Echocardiogram pending.  Renal function improved with latest creatinine 0.83.  Patient remains nonweightbearing right lower extremity from recent ankle fracture with Cam walker boot.  Reported plans to advance her weightbearing 12/09/2017.  Physical and occupational therapy evaluations completed with recommendations of physical medicine rehab consult as patient has requested discharge to home and not skilled nursing facility.   Review of Systems  Constitutional: Negative for chills, diaphoresis and fever.  HENT: Negative for hearing loss.   Eyes: Negative for blurred vision and double vision.  Respiratory: Negative for cough and shortness of breath.   Cardiovascular: Negative for chest pain.  Gastrointestinal: Positive for constipation. Negative for nausea and vomiting.       GERD  Genitourinary: Negative for dysuria, flank pain and hematuria.  Musculoskeletal: Positive for joint pain and myalgias.  Neurological: Positive for weakness.  All  other systems reviewed and are negative.  Past Medical History:  Diagnosis Date  . Arthritis   . Chest pain    Hospital 03/2012  // outpatient nuclear normal 04/02/2012  EF  80%  . Diabetes mellitus   . GERD (gastroesophageal reflux disease)   . Hyperlipidemia   . Hypertension   . Obesity   . Stress at home    Past Surgical History:  Procedure Laterality Date  . ABDOMINAL HYSTERECTOMY    . BACK SURGERY    . ESOPHAGOGASTRODUODENOSCOPY N/A 03/07/2015   Procedure: ESOPHAGOGASTRODUODENOSCOPY (EGD);  Surgeon: Charolett Bumpers, MD;  Location: [facility];  Service: Endoscopy;  Laterality: N/A;  NO PROPOFOL  . ORIF ANKLE FRACTURE Right 10/27/2017   Procedure: OPEN REDUCTION INTERNAL FIXATION (ORIF) ANKLE FRACTURE;  Surgeon: Jodi Geralds, MD;  Location: [facility] OR;  Service: Orthopedics;  Laterality: Right;  . SHOULDER SURGERY     Family History  Problem Relation Age of Onset  . Heart failure Mother   . Heart attack Father    Social History:  reports that she quit smoking about 45 years ago. Her smoking use included cigarettes. She smoked 0.30 packs per day for 0.00 years. She has never used smokeless tobacco. She reports that she does not drink alcohol or use drugs. Allergies:  Allergies  Allergen Reactions  . Liraglutide Nausea And Vomiting  . Niacin Nausea And Vomiting  . Atorvastatin Other (See Comments)    Myalgias  . Darvocet [Propoxyphene N-Acetaminophen] Nausea Only  . Metformin And Related Nausea Only  . Propoxyphene Nausea Only   Medications Prior to Admission  Medication Sig Dispense Refill  . allopurinol (ZYLOPRIM) 300 MG tablet Take 300 mg by mouth daily.    Marland Kitchen amLODipine (NORVASC) 5 MG tablet TAKE 1  TABLET (5 MG TOTAL) BY MOUTH DAILY. 30 tablet 0  . aspirin EC 81 MG tablet Take 1 tablet (81 mg total) by mouth daily. 30 tablet 2  . benazepril (LOTENSIN) 40 MG tablet Take 40 mg by mouth daily.    . cholecalciferol (VITAMIN D) 1000 UNITS tablet Take 1,000 Units by mouth  daily.      Marland Kitchen docusate sodium (COLACE) 100 MG capsule Take 100 mg by mouth at bedtime.    . fluticasone (FLONASE) 50 MCG/ACT nasal spray Place 1-2 sprays into both nostrils daily.    . furosemide (LASIX) 20 MG tablet Take 30 mg by mouth daily. For fluid    . glimepiride (AMARYL) 4 MG tablet Take 8 mg by mouth daily.    Marland Kitchen HYDROcodone-acetaminophen (NORCO) 5-325 MG tablet Take 1-2 tablets by mouth every 6 (six) hours as needed for moderate pain. 50 tablet 0  . hydrocortisone 1 % lotion Apply 1 application topically 2 (two) times daily. 118 mL 0  . Insulin Glargine (LANTUS) 100 UNIT/ML Solostar Pen Inject 10 Units into the skin daily.     . metoprolol succinate (TOPROL-XL) 100 MG 24 hr tablet Take 150 mg by mouth daily.     . nitroGLYCERIN (NITROSTAT) 0.4 MG SL tablet Place 0.4 mg under the tongue as directed. Place one tablet under the tongue every 5 minutes as needed for chest pain    . nystatin cream (MYCOSTATIN) Apply 1 application topically 2 (two) times daily. 100,000 unit/gram\ Apply  To affected area of back for 7 days Start: 11/28/17 Stop : 12/06/17    . ondansetron (ZOFRAN) 4 MG tablet Take 4 mg by mouth 3 (three) times daily as needed for nausea or vomiting. Give 4 mg  By mouth TID prn for nausea x 14 Start 11/27/17 Stop: 12/10/17    . pantoprazole (PROTONIX) 40 MG tablet Take 40 mg by mouth daily.    . potassium chloride SA (K-DUR,KLOR-CON) 20 MEQ tablet Take 20 mEq by mouth 2 (two) times daily.    . promethazine (PHENERGAN) 25 MG tablet Take 25 mg by mouth every 8 (eight) hours as needed for nausea or vomiting. N / V for 3 days  Start 11/28/17 Stop: 12/01/17      Home: Home Living Family/patient expects to be discharged to:: Private residence Living Arrangements: Children Available Help at Discharge: Family, Available 24 hours/day Type of Home: Mobile home Home Access: Ramped entrance Home Layout: One level Bathroom Shower/Tub: Health visitor:  Standard Bathroom Accessibility: No Home Equipment: Walker - 4 wheels, Bedside commode, Hand held shower head Additional Comments: Pt wants to go home with daughter's assistance. Daughter measuring to see if w/c will work at home.   Functional History: Prior Function Level of Independence: Needs assistance Gait / Transfers Assistance Needed: was min assist to supervision for slide board transfers bed>w/c at SNF, unable to stand or walk while NWB on RLE ADL's / Homemaking Assistance Needed: assistance from SNF staff and working with OT on improving independence.  Comments: will be allowed to bear weight on RLE after 4/22 MD follow up Functional Status:  Mobility: Bed Mobility Overal bed mobility: Modified Independent General bed mobility comments: moves to/from EOB unassisted and is able to repostion self in bed with effort, though did ask for help with bed covers Transfers Overall transfer level: Needs assistance Equipment used: Rolling walker (2 wheeled) Transfers: Lateral/Scoot Transfers Sit to Stand: Mod assist, From elevated surface  Lateral/Scoot Transfers: Min assist General transfer comment: Able  to complete multiple lateral scoot transfers from bed to drop-arm recliner.  Ambulation/Gait General Gait Details: unable    ADL: ADL Overall ADL's : Needs assistance/impaired Eating/Feeding: Set up, Sitting Grooming: Supervision/safety, Sitting Upper Body Bathing: Minimal assistance, Sitting Lower Body Bathing: Maximal assistance, Sitting/lateral leans Upper Body Dressing : Minimal assistance, Sitting Lower Body Dressing: Maximal assistance, Sitting/lateral leans Toilet Transfer: Minimal assistance Toilet Transfer Details (indicate cue type and reason): lateral scoot transfer from bed to drop-arm recliner Toileting- Clothing Manipulation and Hygiene: Maximal assistance, Sitting/lateral lean General ADL Comments: Pt motivated to participate with OT. Remains limited by RLE NWB  status but lateral scoot would be an option with drop-arm BSC.   Cognition: Cognition Overall Cognitive Status: Within Functional Limits for tasks assessed Orientation Level: Oriented X4 Cognition Arousal/Alertness: Awake/alert Behavior During Therapy: WFL for tasks assessed/performed Overall Cognitive Status: Within Functional Limits for tasks assessed  Blood pressure (!) 146/55, pulse 80, temperature 98.3 F (36.8 C), temperature source Oral, resp. rate (!) 24, height 5\' 5"  (1.651 m), weight 81.6 kg (180 lb), SpO2 99 %. Physical Exam  Vitals reviewed. Constitutional: She is oriented to person, place, and time. She appears well-developed.  77 year old obese female  HENT:  Head: Normocephalic and atraumatic.  Eyes: EOM are normal. Right eye exhibits no discharge. Left eye exhibits no discharge.  Neck: Normal range of motion. Neck supple. No thyromegaly present.  Cardiovascular: Normal rate, regular rhythm and normal heart sounds.  Respiratory: Effort normal and breath sounds normal. No respiratory distress.  GI: Soft. Bowel sounds are normal. She exhibits no distension.  Musculoskeletal:  RLE tenderness  Neurological: She is alert and oriented to person, place, and time.  Motor: Left upper extremity: 5/5 proximal distal Right upper extremity: 4 -/5 proximal distal Right lower extremity: (Limited by boot) hip flexion, knee extension 3/5 Left lower extremity: Hip flexion, knee extension 3+/5, ankle dorsiflexion 4 -/5  Skin:  Cam Walker boot to right lower extremity.  Psychiatric: She has a normal mood and affect. Her behavior is normal.    Results for orders placed or performed during the hospital encounter of 11/29/17 (from the past 24 hour(s))  Glucose, capillary     Status: Abnormal   Collection Time: 12/01/17  8:38 AM  Result Value Ref Range   Glucose-Capillary 100 (H) 65 - 99 mg/dL   Comment 1 Notify RN    Comment 2 Document in Chart   Glucose, capillary     Status:  Abnormal   Collection Time: 12/01/17  1:00 PM  Result Value Ref Range   Glucose-Capillary 151 (H) 65 - 99 mg/dL  Glucose, capillary     Status: Abnormal   Collection Time: 12/01/17  4:08 PM  Result Value Ref Range   Glucose-Capillary 186 (H) 65 - 99 mg/dL  Glucose, capillary     Status: Abnormal   Collection Time: 12/01/17  8:08 PM  Result Value Ref Range   Glucose-Capillary 116 (H) 65 - 99 mg/dL  Glucose, capillary     Status: Abnormal   Collection Time: 12/02/17 12:10 AM  Result Value Ref Range   Glucose-Capillary 140 (H) 65 - 99 mg/dL  CBC     Status: Abnormal   Collection Time: 12/02/17  2:46 AM  Result Value Ref Range   WBC 5.8 4.0 - 10.5 K/uL   RBC 3.16 (L) 3.87 - 5.11 MIL/uL   Hemoglobin 10.1 (L) 12.0 - 15.0 g/dL   HCT 16.1 (L) 09.6 - 04.5 %   MCV 102.5 (H) 78.0 -  100.0 fL   MCH 32.0 26.0 - 34.0 pg   MCHC 31.2 30.0 - 36.0 g/dL   RDW 16.1 (H) 09.6 - 04.5 %   Platelets 185 150 - 400 K/uL  Basic metabolic panel     Status: Abnormal   Collection Time: 12/02/17  2:46 AM  Result Value Ref Range   Sodium 138 135 - 145 mmol/L   Potassium 4.8 3.5 - 5.1 mmol/L   Chloride 106 101 - 111 mmol/L   CO2 23 22 - 32 mmol/L   Glucose, Bld 127 (H) 65 - 99 mg/dL   BUN 18 6 - 20 mg/dL   Creatinine, Ser 4.09 0.44 - 1.00 mg/dL   Calcium 8.5 (L) 8.9 - 10.3 mg/dL   GFR calc non Af Amer >60 >60 mL/min   GFR calc Af Amer >60 >60 mL/min   Anion gap 9 5 - 15  Glucose, capillary     Status: Abnormal   Collection Time: 12/02/17  3:28 AM  Result Value Ref Range   Glucose-Capillary 153 (H) 65 - 99 mg/dL   Nm Pulmonary Perf And Vent  Result Date: 11/30/2017 CLINICAL DATA:  77 year old female status post recent surgery with weakness and shortness of breath. EXAM: NUCLEAR MEDICINE VENTILATION - PERFUSION LUNG SCAN TECHNIQUE: Ventilation images were obtained in multiple projections using inhaled aerosol Tc-56m DTPA. Perfusion images were obtained in multiple projections after intravenous injection  of Tc-71m-MAA. RADIOPHARMACEUTICALS:  32.0 mCi of Tc-68m DTPA aerosol inhalation and 4.2 mCi Tc54m-MAA IV COMPARISON:  Portable chest 11/29/2017. FINDINGS: Ventilation: Substantially heterogeneous bilateral ventilation radiotracer might be related to right greater than left central clumping of radiotracer particles. Perfusion: Lateral views were unable to be obtained as the patient could not raise her arms. No perfusion defects seen. IMPRESSION: No perfusion defect to suggest pulmonary embolus. Electronically Signed   By: Odessa Fleming M.D.   On: 11/30/2017 09:58    Assessment/Plan: Diagnosis: debility Labs and images independently reviewed.  Records reviewed and summated above.  1. Does the need for close, 24 hr/day medical supervision in concert with the patient's rehab needs make it unreasonable for this patient to be served in a less intensive setting? No  2. Co-Morbidities requiring supervision/potential complications: HTN (labile at present, monitor and provide prns in accordance with increased physical exertion and pain), diabetes mellitus (Monitor in accordance with exercise and adjust meds as necessary), right ankle displaced trimalleolar fracture with ORIF (nonweightbearing at present), ABLA (transfuse if necessary to ensure appropriate perfusion for increased activity tolerance), acute low UTI (continue abx, follow cultures) 3. Due to safety, skin/wound care and patient education, does the patient require 24 hr/day rehab nursing? No 4. Does the patient require coordinated care of a physician, rehab nurse, PT (1-2 hrs/day, 5 days/week) and OT (1-2 hrs/day, 5 days/week) to address physical and functional deficits in the context of the above medical diagnosis(es)? No Addressing deficits in the following areas: balance, endurance, locomotion, strength, transferring, bathing, dressing, toileting and psychosocial support 5. Can the patient actively participate in an intensive therapy program of at least 3  hrs of therapy per day at least 5 days per week? Yes 6. The potential for patient to make measurable gains while on inpatient rehab is good 7. Anticipated functional outcomes upon discharge from inpatient rehab are n/a  with PT, n/a with OT, n/a with SLP. 8. Estimated rehab length of stay to reach the above functional goals is: NA 9. Anticipated D/C setting: Home 10. Anticipated post D/C treatments: HH therapy  and Home excercise program 11. Overall Rehab/Functional Prognosis: excellent  RECOMMENDATIONS: This patient's condition is appropriate for continued rehabilitative care in the following setting: Patient states that she has already paid for her stay at SNF, which her daughter prefers she goes back to. However, patient does not wish to return and states she has a daughter coming in from out of town to stay with her. At this point, patient does not have a medical need to want IRF. Recommend home with home health after completion of medical workup if daughter can provide assistance. Otherwise, recommend SNF.  Patient has agreed to participate in recommended program. Potentially Note that insurance prior authorization may be required for reimbursement for recommended care.  Comment: Rehab Admissions Coordinator to follow up.  Maryla MorrowAnkit Serria Sloma, MD, ABPMR Mcarthur Rossettianiel J Angiulli, PA-C 12/02/2017

## 2017-12-02 NOTE — Progress Notes (Signed)
Clinical Social Worker facilitated patient discharge including contacting patient family and facility to confirm patient discharge plans.  Clinical information faxed to facility and family agreeable with plan.  CSW arranged [facility]  RN to call [phone] and [facility]pt will go in room 1202p) for report prior to discharge.  Clinical Social Worker will sign off for now as social work intervention is no longer needed. Please consult us again if new need arises.  Marrianne MoodAshley Shawn Carattini, MSW, [employer]CSWA [phone]

## 2017-12-02 NOTE — [place] FL2 (Signed)
[place] MEDICAID FL2 LEVEL OF CARE SCREENING TOOL     IDENTIFICATION  Patient Name: [name] Birthdate: [birth year] Sex: female Admission Date (Current Location): 11/29/2017  [facility] and [place] Number:  Producer, television/film/video and Address:  [facility] [place], [address] [place], [place] [address]      Provider Number: [id number]  Attending Physician Name and Address:  Cathren Harsh, MD  Relative Name and Phone Number:  Eunice Blase [phone]    Current Level of Care: Hospital Recommended Level of Care: Skilled Nursing Facility Prior Approval Number:    Date Approved/Denied:   PASRR Number:    Discharge Plan: SNF    Current Diagnoses: Patient Active Problem List   Diagnosis Date Noted  . Labile blood pressure   . Closed fracture of right ankle   . Acute blood loss anemia   . AKI (acute kidney injury) (HCC) 11/29/2017  . Dehydration 11/29/2017  . Hypokalemia 11/29/2017  . Hyperkalemia, diminished renal excretion 11/29/2017  . Bacteria in urine 11/29/2017  . Displaced trimalleolar fracture of right ankle 10/27/2017  . BMI 32.0-32.9,adult 04/02/2016  . Skin candidiasis 04/02/2016  . Chest pain   . Atypical chest pain 03/30/2012  . DM (diabetes mellitus), type 2, uncontrolled with complications (HCC) 03/30/2012  . Hypertension   . GERD (gastroesophageal reflux disease)   . Hyperlipidemia   . Arthritis     Orientation RESPIRATION BLADDER Height & Weight     Time, Self, Situation, Place  Normal Continent, External catheter Weight: 180 lb (81.6 kg) Height:  5\' 5"  (165.1 cm)  BEHAVIORAL SYMPTOMS/MOOD NEUROLOGICAL BOWEL NUTRITION STATUS      Continent    AMBULATORY STATUS COMMUNICATION OF NEEDS Skin   Extensive Assist(non weight bearing on L foor) Verbally                         Personal Care Assistance Level of Assistance  Bathing, Feeding, Dressing Bathing Assistance: Limited assistance Feeding assistance:  Independent Dressing Assistance: Limited assistance     Functional Limitations Info  Sight, Hearing, Speech Sight Info: Adequate Hearing Info: Adequate Speech Info: Adequate    SPECIAL CARE FACTORS FREQUENCY  PT (By licensed PT), OT (By licensed OT)     PT Frequency: 5x wk OT Frequency: 5x wk            Contractures Contractures Info: Not present    Additional Factors Info  Allergies, Code Status Code Status Info: full code Allergies Info: LIRAGLUTIDE, NIACIN, ATORVASTATIN, DARVOCET PROPOXYPHENE N-ACETAMINOPHEN, METFORMIN AND RELATED, PROPOXYPHENE            Current Medications (12/02/2017):  This is the current hospital active medication list Current Facility-Administered Medications  Medication Dose Route Frequency Provider Last Rate Last Dose  . acetaminophen (TYLENOL) tablet 650 mg  650 mg Oral Q6H PRN Therisa Doyne, MD   650 mg at 12/02/17 0455   Or  . acetaminophen (TYLENOL) suppository 650 mg  650 mg Rectal Q6H PRN Doutova, Anastassia, MD      . allopurinol (ZYLOPRIM) tablet 300 mg  300 mg Oral Daily Doutova, Anastassia, MD   300 mg at 12/02/17 0901  . aspirin EC tablet 81 mg  81 mg Oral Daily Doutova, Anastassia, MD   81 mg at 12/02/17 0902  . cefTRIAXone (ROCEPHIN) 1 g in sodium chloride 0.9 % 100 mL IVPB  1 g Intravenous Q24H Therisa Doyne, MD   Stopped at 12/02/17 0301  . enoxaparin (  LOVENOX) injection 40 mg  40 mg Subcutaneous Daily Doutova, Anastassia, MD   40 mg at 12/02/17 0902  . HYDROcodone-acetaminophen (NORCO/VICODIN) 5-325 MG per tablet 1-2 tablet  1-2 tablet Oral Q4H PRN Doutova, Anastassia, MD      . insulin aspart (novoLOG) injection 0-9 Units  0-9 Units Subcutaneous Q4H Therisa Doyneoutova, Anastassia, MD   3 Units at 12/02/17 1158  . metoprolol tartrate (LOPRESSOR) tablet 50 mg  50 mg Oral BID Therisa Doyneoutova, Anastassia, MD   50 mg at 12/02/17 0902  . ondansetron (ZOFRAN) tablet 4 mg  4 mg Oral Q6H PRN Doutova, Anastassia, MD       Or  . ondansetron  (ZOFRAN) injection 4 mg  4 mg Intravenous Q6H PRN Doutova, Anastassia, MD      . pantoprazole (PROTONIX) EC tablet 40 mg  40 mg Oral Daily Doutova, Anastassia, MD   40 mg at 12/02/17 0902     Discharge Medications: Please see discharge summary for a list of discharge medications.  Relevant Imaging Results:  Relevant Lab Results:   Additional Information SS#: [id number]  Althea CharonAshley C Jahquez [place], [place]

## 2017-12-02 NOTE — Discharge Summary (Signed)
Physician Discharge Summary   Patient ID: [name] MRN: [id number] DOB/AGE: 78-Feb-1942 78 y.o.  Admit date: 11/29/2017 Discharge date: 12/02/2017  Primary Care Physician:  Elizabeth Palau, FNP   Recommendations for Outpatient Follow-up:  1. Follow up with PCP in 1-2 weeks 2. Please obtain BMP/CBC in one week  Home Health: Skilled nursing facility for rehab Equipment/Devices:   Discharge Condition: stable  CODE STATUS: FULL  Diet recommendation: Carb modified diet   Discharge Diagnoses:     E Coli UTI   Hyperkalemia   Seizure-like activity secondary to severe hypoglycemia . AKI (acute kidney injury) (HCC) . Hypertension . Hyperlipidemia . GERD (gastroesophageal reflux disease) . DM (diabetes mellitus), type 2, uncontrolled with complications (HCC) . Dehydration    Consults: none     Allergies:   Allergies  Allergen Reactions  . Liraglutide Nausea And Vomiting  . Niacin Nausea And Vomiting  . Atorvastatin Other (See Comments)    Myalgias  . Darvocet [Propoxyphene N-Acetaminophen] Nausea Only  . Metformin And Related Nausea Only  . Propoxyphene Nausea Only     DISCHARGE MEDICATIONS: Allergies as of 12/02/2017      Reactions   Liraglutide Nausea And Vomiting   Niacin Nausea And Vomiting   Atorvastatin Other (See Comments)   Myalgias   Darvocet [propoxyphene N-acetaminophen] Nausea Only   Metformin And Related Nausea Only   Propoxyphene Nausea Only      Medication List    STOP taking these medications   amLODipine 5 MG tablet Commonly known as:  NORVASC   benazepril 40 MG tablet Commonly known as:  LOTENSIN   furosemide 20 MG tablet Commonly known as:  LASIX   glimepiride 4 MG tablet Commonly known as:  AMARYL   metoprolol succinate 100 MG 24 hr tablet Commonly known as:  TOPROL-XL Replaced by:  metoprolol tartrate 50 MG tablet   potassium chloride SA 20 MEQ tablet Commonly known as:  K-DUR,KLOR-CON     TAKE these medications    allopurinol 300 MG tablet Commonly known as:  ZYLOPRIM Take 300 mg by mouth daily.   aspirin EC 81 MG tablet Take 1 tablet (81 mg total) by mouth daily.   cephALEXin 500 MG capsule Commonly known as:  KEFLEX Take 1 capsule (500 mg total) by mouth 2 (two) times daily. X 4 days   cholecalciferol 1000 units tablet Commonly known as:  VITAMIN D Take 1,000 Units by mouth daily.   docusate sodium 100 MG capsule Commonly known as:  COLACE Take 100 mg by mouth at bedtime.   fluticasone 50 MCG/ACT nasal spray Commonly known as:  FLONASE Place 1-2 sprays into both nostrils daily.   HYDROcodone-acetaminophen 5-325 MG tablet Commonly known as:  NORCO Take 1-2 tablets by mouth every 6 (six) hours as needed for moderate pain.   hydrocortisone 1 % lotion Apply 1 application topically 2 (two) times daily.   insulin aspart 100 UNIT/ML injection Commonly known as:  NOVOLOG Sliding scale CBG 70 - 120: 0 units CBG 121 - 150: 1 unit,  CBG 151 - 200: 2 units,  CBG 201 - 250: 3 units,  CBG 251 - 300: 5 units,  CBG 301 - 350: 7 units,  CBG 351 - 400: 9 units   CBG > 400: 9 units and notify your MD   Insulin Glargine 100 UNIT/ML Solostar Pen Commonly known as:  LANTUS Inject 5 Units into the skin daily. What changed:  how much to take   metoprolol tartrate 50 MG tablet  Commonly known as:  LOPRESSOR Take 1 tablet (50 mg total) by mouth 2 (two) times daily. Replaces:  metoprolol succinate 100 MG 24 hr tablet   NITROSTAT 0.4 MG SL tablet Generic drug:  nitroGLYCERIN Place 0.4 mg under the tongue as directed. Place one tablet under the tongue every 5 minutes as needed for chest pain   nystatin cream Commonly known as:  MYCOSTATIN Apply 1 application topically 2 (two) times daily. 100,000 unit/gram\ Apply  To affected area of back for 7 days Start: 11/28/17 Stop : 12/06/17   ondansetron 4 MG tablet Commonly known as:  ZOFRAN Take 4 mg by mouth 3 (three) times daily as needed for nausea  or vomiting. Give 4 mg  By mouth TID prn for nausea x 14 Start 11/27/17 Stop: 12/10/17   pantoprazole 40 MG tablet Commonly known as:  PROTONIX Take 40 mg by mouth daily.   promethazine 25 MG tablet Commonly known as:  PHENERGAN Take 25 mg by mouth every 8 (eight) hours as needed for nausea or vomiting. N / V for 3 days  Start 11/28/17 Stop: 12/01/17        Brief H and P: For complete details please refer to admission H and P, but in brief 77 y.o.femalewith medical history significant of DM2 recent right ankle FRX, HLD, HTN, GERD presented withabnormal labs potassium 7.0 patient has been reporting some generalized fatigue.Decreased p.o. intake recently her potassium supplementation had been doubled. Recently undergone right ankle repair status post fracture has been discharged to SNF. Reports no disuria but some chills. No SOB no chest pain. Reports diarrhea and occasional vomiting x2 . States she has been on stool softeners.   Hospital Course:   Hyperkalemia -Potassium 6.9 at the time of admission -Iatrogenic secondary to ACE inhibitor, potassium supplements  - Now improved with D50, insulin, sodium bicarb and albuterol in ED  -Potassium 4.8 at the time of discharge     GERD (gastroesophageal reflux disease) -Continue PPI  Hypertension - Currently stable, continue metoprolol  - Discontinued amlodipine, benazepril, Lasix    DM (diabetes mellitus), type 2, uncontrolled with complications (HCC), hypoglycemia -Patient was hypoglycemic in ED, with CBG in 40s, possibly due to Amaryl, Lantus and not eating - Discontinued Amaryl, patient was on Lantus 10 units daily, decreased to 5 units daily, continue sliding scale insulin  Seizure-like activity likely due to severe hypoglycemia -Patient had received insulin IV with D50 for hyperkalemia and had not been eating much in the nursing home due to poor appetite, also on Amaryl 8 mg daily with Lantus  - CBG was down to  48, which precipitated seizure-like activity. -Patient received D50 with improvement in her symptoms -Amaryl discontinued, Lantus decreased to half.  Patient has no further seizure-like activities during the hospitalization    AKI (acute kidney injury) (HCC) with dehydration -Patient presented with creatinine of 1.9, baseline 0.8-0.9 - creatinine improved with IV fluids, creatinine 0.8 at the time of discharge  UTI -She was placed on IV Rocephin, urine culture and sensitivities showed more than 100,000 colonies of E. Coli. -Transition to oral Keflex  Right ankle fracture -Followed by orthopedics, PT OT evaluation patient will continue skilled nursing facility for rehab and follow outpatient with orthopedics -  -Continue pain control    Day of Discharge S: Feels a lot better today, no acute issues overnight, no fevers or chills  BP (!) 149/70 (BP Location: Left Arm)   Pulse 84   Temp 98.3 F (36.8 C) (Oral)  Resp 18   Ht 5\' 5"  (1.651 m)   Wt 81.6 kg (180 lb)   SpO2 95%   BMI 29.95 kg/m   Physical Exam: General: Alert and awake oriented x3 not in any acute distress. HEENT: anicteric sclera, pupils reactive to light and accommodation CVS: S1-S2 clear no murmur rubs or gallops Chest: clear to auscultation bilaterally, no wheezing rales or rhonchi Abdomen: soft nontender, nondistended, normal bowel sounds Extremities: Right lower extremity in the boot, Neuro: Cranial nerves II-XII intact, no focal neurological deficits   The results of significant diagnostics from this hospitalization (including imaging, microbiology, ancillary and laboratory) are listed below for reference.      Procedures/Studies:  Nm Pulmonary Perf And Vent  Result Date: 11/30/2017 CLINICAL DATA:  77 year old female status post recent surgery with weakness and shortness of breath. EXAM: NUCLEAR MEDICINE VENTILATION - PERFUSION LUNG SCAN TECHNIQUE: Ventilation images were obtained in multiple  projections using inhaled aerosol Tc-50m DTPA. Perfusion images were obtained in multiple projections after intravenous injection of Tc-74m-MAA. RADIOPHARMACEUTICALS:  32.0 mCi of Tc-36m DTPA aerosol inhalation and 4.2 mCi Tc41m-MAA IV COMPARISON:  Portable chest 11/29/2017. FINDINGS: Ventilation: Substantially heterogeneous bilateral ventilation radiotracer might be related to right greater than left central clumping of radiotracer particles. Perfusion: Lateral views were unable to be obtained as the patient could not raise her arms. No perfusion defects seen. IMPRESSION: No perfusion defect to suggest pulmonary embolus. Electronically Signed   By: Odessa Fleming M.D.   On: 11/30/2017 09:58   Dg Chest Portable 1 View  Result Date: 11/29/2017 CLINICAL DATA:  Shortness of breath EXAM: PORTABLE CHEST 1 VIEW COMPARISON:  01/16/2015 FINDINGS: Postsurgical changes in the proximal right humerus. Coarse interstitial opacity consistent with chronic change. No acute consolidation or effusion. Normal heart size. Aortic atherosclerosis. No pneumothorax. IMPRESSION: No active disease.  Chronic appearing bronchitic changes. Electronically Signed   By: Jasmine Pang M.D.   On: 11/29/2017 20:06       LAB RESULTS: Basic Metabolic Panel: Recent Labs  Lab 11/30/17 0344  12/01/17 0427 12/02/17 0246  NA 138   < > 137 138  K 6.2*   < > 5.1 4.8  CL 111   < > 107 106  CO2 19*  --  22 23  GLUCOSE 168*   < > 95 127*  BUN 38*   < > 20 18  CREATININE 1.56*   < > 0.92 0.83  CALCIUM 8.2*  --  8.5* 8.5*  MG 1.7  --   --   --   PHOS 3.7  --   --   --    < > = values in this interval not displayed.   Liver Function Tests: Recent Labs  Lab 11/29/17 1631 11/30/17 0344  AST 27 23  ALT 15 10*  ALKPHOS 79 63  BILITOT 0.5 0.5  PROT 6.7 5.2*  ALBUMIN 3.3* 2.5*   No results for input(s): LIPASE, AMYLASE in the last 168 hours. No results for input(s): AMMONIA in the last 168 hours. CBC: Recent Labs  Lab 12/01/17 0427  12/02/17 0246  WBC 5.7 5.8  NEUTROABS 2.9  --   HGB 10.2* 10.1*  HCT 32.7* 32.4*  MCV 102.8* 102.5*  PLT 179 185   Cardiac Enzymes: Recent Labs  Lab 11/30/17 0834 11/30/17 1539  TROPONINI <0.03 <0.03   BNP: Invalid input(s): POCBNP CBG: Recent Labs  Lab 12/02/17 0833 12/02/17 1153  GLUCAP 98 206*      Disposition and Follow-up: Discharge Instructions  Diet Carb Modified   Complete by:  As directed    Increase activity slowly   Complete by:  As directed        DISPOSITION: Skilled nursing facility   DISCHARGE FOLLOW-UP Follow-up Information    Elizabeth Palau, FNP. Schedule an appointment as soon as possible for a visit in 2 week(s).   Specialty:  Nurse Practitioner Contact information: [address] [place] [address] [phone]            Time coordinating discharge:  35-minute  Signed:   Thad Ranger M.D. [facility] 12/02/2017, 12:22 PM Pager: [phone]

## 2017-12-02 NOTE — Progress Notes (Signed)
Report called to [facility]. All questions answered.

## 2017-12-02 NOTE — Clinical Social Work Note (Signed)
Clinical Social Work Assessment  Patient Details  Name: [name] MRN: [id number] Date of Birth: Apr 06, 1941  Date of referral:  12/02/17               Reason for consult:  Discharge Planning, Facility Placement                Permission sought to share information with:  Family Supports Permission granted to share information::  Yes, Verbal Permission Granted  Name::     [name]   Agency::  [facility]  Relationship::  daughter  Contact Information:  [phone]  Housing/Transportation Living arrangements for the past 2 months:  [place] of Information:  Patient Patient Interpreter Needed:  None Criminal Activity/Legal Involvement Pertinent to Current Situation/Hospitalization:  No - Comment as needed Significant Relationships:  Adult Children Lives with:  Self Do you feel safe going back to the place where you live?  Yes Need for family participation in patient care:  Yes (Comment)  Care giving concerns: No family at bedside. Patient stated she has support from adult daughter. CSW spoke with daughter via phone. [name] stated she would like patient to go back to [facility] and patient stated she agrees with decision.  Social Worker assessment / plan:  CSW met patient at bedside. Patient stated she was at [facility] prior to coming to the hospital. Patient stated she lives alone and daughter is supportive but stated daughter works so she is unable to be with patient. Family agrees with returning back to facility until patient is able to put weight back on her foot. CSW reached out to [facility] and admission coordinator stated they will be able to take patient once cleared by MD Employment status:  Retired Nurse, adult PT Recommendations:  [place] / Referral to community resources:  [place]  Patient/Family's Response to care:  Patient wanting to get back on her feet    Patient/Family's Understanding of and Emotional Response to Diagnosis, Current Treatment, and Prognosis:  Patient appreciates the care she has received from hospital  Emotional Assessment Appearance:  Appears stated age Attitude/Demeanor/Rapport:  Engaged Affect (typically observed):  Accepting Orientation:  Oriented to Self, Oriented to Place, Oriented to  Time, Oriented to Situation Alcohol / Substance use:  Not Applicable Psych involvement (Current and /or in the community):  No (Comment)  Discharge Needs  Concerns to be addressed:  No discharge needs identified Readmission within the last 30 days:  No Current discharge risk:  None Barriers to Discharge:  No Barriers Identified   Wende Neighbors, LCSW 12/02/2017, 11:37 AM

## 2017-12-02 NOTE — Clinical Social Work Placement (Signed)
   CLINICAL SOCIAL WORK PLACEMENT  NOTE  Date:  12/02/2017  Patient Details  Name: [name] MRN: [id number] Date of Birth: 1940/12/05  Clinical Social Work is seeking post-discharge placement for this patient at the Skilled  Nursing Facility level of care (*CSW will initial, date and re-position this form in  chart as items are completed):  Yes   Patient/family provided with [facility] Clinical Social Work Department's list of facilities offering this level of care within the geographic area requested by the patient (or if unable, by the patient's family).  Yes   Patient/family informed of their freedom to choose among providers that offer the needed level of care, that participate in Medicare, Medicaid or managed care program needed by the patient, have an available bed and are willing to accept the patient.  Yes   Patient/family informed of [name] ownership interest in [name] and [name], as well as of the fact that they are under no obligation to receive care at these facilities.  PASRR submitted to EDS on       PASRR number received on       Existing PASRR number confirmed on       FL2 transmitted to all facilities in geographic area requested by pt/family on       FL2 transmitted to all facilities within larger geographic area on       Patient informed that his/her managed care company has contracts with or will negotiate with certain facilities, including the following:            Patient/family informed of bed offers received.  [name]     Physician recommends and patient chooses bed at      [name]  Patient to be transferred to facility by [facility]     Patient family notified on 12/02/17 of transfer.  Name of family member notified:  daughter debbie aware of discharge     PHYSICIAN       Additional Comment:     _______________________________________________ Althea CharonAshley C Yovanny Coats, LCSW 12/02/2017, 12:43 PM

## 2018-09-08 ENCOUNTER — Encounter (HOSPITAL_COMMUNITY): Payer: Self-pay | Admitting: *Deleted

## 2018-09-08 ENCOUNTER — Other Ambulatory Visit: Payer: Self-pay

## 2018-09-08 ENCOUNTER — Emergency Department (HOSPITAL_COMMUNITY)
Admission: EM | Admit: 2018-09-08 | Discharge: 2018-09-08 | Disposition: A | Discharge status: Home / Self Care | Payer: Medicare HMO | Attending: Emergency Medicine | Admitting: Emergency Medicine

## 2018-09-08 ENCOUNTER — Emergency Department (HOSPITAL_COMMUNITY): Payer: Medicare HMO

## 2018-09-08 DIAGNOSIS — R1084 Generalized abdominal pain: Secondary | ICD-10-CM

## 2018-09-08 DIAGNOSIS — Z7982 Long term (current) use of aspirin: Secondary | ICD-10-CM | POA: Insufficient documentation

## 2018-09-08 DIAGNOSIS — R112 Nausea with vomiting, unspecified: Secondary | ICD-10-CM | POA: Insufficient documentation

## 2018-09-08 DIAGNOSIS — I10 Essential (primary) hypertension: Secondary | ICD-10-CM | POA: Insufficient documentation

## 2018-09-08 DIAGNOSIS — Z87891 Personal history of nicotine dependence: Secondary | ICD-10-CM | POA: Diagnosis not present

## 2018-09-08 DIAGNOSIS — Z794 Long term (current) use of insulin: Secondary | ICD-10-CM | POA: Insufficient documentation

## 2018-09-08 DIAGNOSIS — E119 Type 2 diabetes mellitus without complications: Secondary | ICD-10-CM | POA: Insufficient documentation

## 2018-09-08 LAB — CBC WITH DIFFERENTIAL/PLATELET
Abs Immature Granulocytes: 0.04 10*3/uL (ref 0.00–0.07)
BASOS ABS: 0.1 10*3/uL (ref 0.0–0.1)
Basophils Relative: 1 %
EOS ABS: 0.2 10*3/uL (ref 0.0–0.5)
Eosinophils Relative: 2 %
HCT: 40.5 % (ref 36.0–46.0)
HEMOGLOBIN: 12.6 g/dL (ref 12.0–15.0)
Immature Granulocytes: 0 %
LYMPHS PCT: 24 %
Lymphs Abs: 2.3 10*3/uL (ref 0.7–4.0)
MCH: 29.9 pg (ref 26.0–34.0)
MCHC: 31.1 g/dL (ref 30.0–36.0)
MCV: 96 fL (ref 80.0–100.0)
Monocytes Absolute: 0.7 10*3/uL (ref 0.1–1.0)
Monocytes Relative: 8 %
NRBC: 0 % (ref 0.0–0.2)
Neutro Abs: 6.3 10*3/uL (ref 1.7–7.7)
Neutrophils Relative %: 65 %
Platelets: 331 10*3/uL (ref 150–400)
RBC: 4.22 MIL/uL (ref 3.87–5.11)
RDW: 14.1 % (ref 11.5–15.5)
WBC: 9.6 10*3/uL (ref 4.0–10.5)

## 2018-09-08 LAB — URINALYSIS, ROUTINE W REFLEX MICROSCOPIC
BILIRUBIN URINE: NEGATIVE
Glucose, UA: NEGATIVE mg/dL
HGB URINE DIPSTICK: NEGATIVE
Ketones, ur: NEGATIVE mg/dL
Leukocytes, UA: NEGATIVE
NITRITE: NEGATIVE
PROTEIN: NEGATIVE mg/dL
Specific Gravity, Urine: 1.01 (ref 1.005–1.030)
pH: 7 (ref 5.0–8.0)

## 2018-09-08 LAB — COMPREHENSIVE METABOLIC PANEL
ALT: 13 U/L (ref 0–44)
ANION GAP: 9 (ref 5–15)
AST: 16 U/L (ref 15–41)
Albumin: 3.5 g/dL (ref 3.5–5.0)
Alkaline Phosphatase: 68 U/L (ref 38–126)
BUN: 23 mg/dL (ref 8–23)
CHLORIDE: 100 mmol/L (ref 98–111)
CO2: 25 mmol/L (ref 22–32)
Calcium: 9 mg/dL (ref 8.9–10.3)
Creatinine, Ser: 0.75 mg/dL (ref 0.44–1.00)
GFR calc Af Amer: >60 mL/min (ref >60)
GFR calc non Af Amer: >60 mL/min (ref >60)
Glucose, Bld: 110 mg/dL — ABNORMAL HIGH (ref 70–99)
Potassium: 4 mmol/L (ref 3.5–5.1)
SODIUM: 134 mmol/L — AB (ref 135–145)
Total Bilirubin: 0.6 mg/dL (ref 0.3–1.2)
Total Protein: 7 g/dL (ref 6.5–8.1)

## 2018-09-08 LAB — LIPASE, BLOOD: LIPASE: 18 U/L (ref 11–51)

## 2018-09-08 MED ORDER — ONDANSETRON 4 MG PO TBDP: 4.0000 mg | ORAL_TABLET | Freq: Three times a day (TID) | ORAL | 1 refills | Status: AC | PRN

## 2018-09-08 MED ORDER — ONDANSETRON HCL 4 MG/2ML IJ SOLN
4.0000 mg | Freq: Once | INTRAMUSCULAR | Status: AC
Start: 2018-09-08 — End: 2018-09-08
  Administered 2018-09-08: 4 mg via INTRAVENOUS
  Filled 2018-09-08: qty 2

## 2018-09-08 MED ORDER — FENTANYL CITRATE (PF) 100 MCG/2ML IJ SOLN
50.0000 ug | Freq: Once | INTRAMUSCULAR | Status: AC
  Administered 2018-09-08: 50 ug via INTRAVENOUS
  Filled 2018-09-08: qty 2

## 2018-09-08 MED ORDER — ONDANSETRON HCL 4 MG/2ML IJ SOLN
4.0000 mg | Freq: Once | INTRAMUSCULAR | Status: AC
  Administered 2018-09-08: 4 mg via INTRAVENOUS
  Filled 2018-09-08: qty 2

## 2018-09-08 MED ORDER — IOPAMIDOL (ISOVUE-300) INJECTION 61%
100.0000 mL | Freq: Once | INTRAVENOUS | Status: AC | PRN
  Administered 2018-09-08: 100 mL via INTRAVENOUS

## 2018-09-08 NOTE — ED Provider Notes (Signed)
CT scan of the abdomen without any acute findings.  Patient overall feeling better still little bit nauseated.  We will provide her with a prescription for Zofran.  Patient did not want any medication for pain.   Vanetta Mulders, MD 09/08/18 [phone]

## 2018-09-08 NOTE — ED Triage Notes (Signed)
Pt c/o abd pain that started a few days ago with nausea. Denies any diarrhea, states that the pain started after eating at [employer] 3 days ago,

## 2018-09-08 NOTE — Discharge Instructions (Addendum)
CT scan of the abdomen without any acute findings.  Return for any new or worse symptoms.  Follow-up with your regular doctor in the next few days.  Take the Zofran as needed for nausea and vomiting.

## 2018-09-08 NOTE — ED Provider Notes (Signed)
[facility] Provider Note   CSN: [id number] Arrival date & time: 09/08/18  0457     History   Chief Complaint Chief Complaint  Patient presents with  . Abdominal Pain    HPI [name] is a 78 y.o. female.  The history is provided by the patient.  Abdominal Pain  Pain location:  Generalized Pain quality: cramping   Pain radiates to:  Does not radiate Pain severity:  Moderate Duration:  3 days Timing:  Intermittent Progression:  Worsening Chronicity:  New Relieved by:  Nothing Worsened by:  Movement and palpation Associated symptoms: constipation, nausea and vomiting   Associated symptoms: no chest pain, no diarrhea, no dysuria, no fever, no hematemesis and no hematochezia   Patient reports abdominal pain for over 3 days [facility] She has had nausea and vomiting.  No constipation.  No blood in her stool. No chest pain or shortness of breath. Past Medical History:  Diagnosis Date  . Arthritis   . Chest pain    Hospital 03/2012  // outpatient nuclear normal 04/02/2012  EF  80%  . Diabetes mellitus   . GERD (gastroesophageal reflux disease)   . Hyperlipidemia   . Hypertension   . Obesity   . Stress at home     Patient Active Problem List   Diagnosis Date Noted  . Labile blood pressure   . Closed fracture of right ankle   . Acute blood loss anemia   . AKI (acute kidney injury) (HCC) 11/29/2017  . Dehydration 11/29/2017  . Hypokalemia 11/29/2017  . Hyperkalemia, diminished renal excretion 11/29/2017  . Bacteria in urine 11/29/2017  . Displaced trimalleolar fracture of right ankle 10/27/2017  . BMI 32.0-32.9,adult 04/02/2016  . Skin candidiasis 04/02/2016  . Chest pain   . Atypical chest pain 03/30/2012  . DM (diabetes mellitus), type 2, uncontrolled with complications (HCC) 03/30/2012  . Hypertension   . GERD (gastroesophageal reflux disease)   . Hyperlipidemia   . Arthritis     Past Surgical History:    Procedure Laterality Date  . ABDOMINAL HYSTERECTOMY    . ESOPHAGOGASTRODUODENOSCOPY N/A 03/07/2015   Procedure: ESOPHAGOGASTRODUODENOSCOPY (EGD);  Surgeon: Charolett BumpersMartin K Johnson, MD;  [facility];  Service: Endoscopy;  Laterality: N/A;  NO PROPOFOL  . ORIF ANKLE FRACTURE Right 10/27/2017   Procedure: OPEN REDUCTION INTERNAL FIXATION (ORIF) ANKLE FRACTURE;  Surgeon: Jodi GeraldsGraves, John, MD;  Location: [facility] OR;  Service: Orthopedics;  Laterality: Right;  . SHOULDER SURGERY       OB History   No obstetric history on file.      Home Medications    Prior to Admission medications   Medication Sig Start Date End Date Taking? Authorizing Provider  allopurinol (ZYLOPRIM) 300 MG tablet Take 300 mg by mouth daily.    [provider]  aspirin EC 81 MG tablet Take 1 tablet (81 mg total) by mouth daily. 10/28/17 10/28/18  Marshia LyBethune, James, PA-C  cholecalciferol (VITAMIN D) 1000 UNITS tablet Take 1,000 Units by mouth daily.      [provider]  docusate sodium (COLACE) 100 MG capsule Take 100 mg by mouth at bedtime.    [provider]  fluticasone (FLONASE) 50 MCG/ACT nasal spray Place 1-2 sprays into both nostrils daily. 08/29/17   [provider]  hydrocortisone 1 % lotion Apply 1 application topically 2 (two) times daily. 04/02/16   Johna SheriffVincent, Carol L, MD  insulin aspart (NOVOLOG) 100 UNIT/ML injection Sliding scale CBG 70 -  120: 0 units CBG 121 - 150: 1 unit,  CBG 151 - 200: 2 units,  CBG 201 - 250: 3 units,  CBG 251 - 300: 5 units,  CBG 301 - 350: 7 units,  CBG 351 - 400: 9 units   CBG > 400: 9 units and notify your MD 12/02/17 12/02/18  Rai, Delene Ruffini, MD  Insulin Glargine (LANTUS) 100 UNIT/ML Solostar Pen Inject 5 Units into the skin daily. 12/02/17   Rai, Delene Ruffini, MD  metoprolol tartrate (LOPRESSOR) 50 MG tablet Take 1 tablet (50 mg total) by mouth 2 (two) times daily. 12/02/17   Rai, Delene Ruffini, MD  nitroGLYCERIN (NITROSTAT) 0.4 MG SL tablet Place 0.4 mg under the  tongue as directed. Place one tablet under the tongue every 5 minutes as needed for chest pain 03/22/15   [provider]  nystatin cream (MYCOSTATIN) Apply 1 application topically 2 (two) times daily. 100,000 unit/gram\ Apply  To affected area of back for 7 days Start: 11/28/17 Stop : 12/06/17    [provider]  ondansetron (ZOFRAN) 4 MG tablet Take 4 mg by mouth 3 (three) times daily as needed for nausea or vomiting. Give 4 mg  By mouth TID prn for nausea x 14 Start 11/27/17 Stop: 12/10/17    [provider]  pantoprazole (PROTONIX) 40 MG tablet Take 40 mg by mouth daily. 08/03/15   [provider]  promethazine (PHENERGAN) 25 MG tablet Take 25 mg by mouth every 8 (eight) hours as needed for nausea or vomiting. N / V for 3 days  Start 11/28/17 Stop: 12/01/17    [provider]    Family History Family History  Problem Relation Age of Onset  . Heart failure Mother   . Heart attack Father     Social History Social History   Tobacco Use  . Smoking status: Former Smoker    Packs/day: 0.30    Years: 0.00    Pack years: 0.00    Types: Cigarettes    Last attempt to quit: 03/30/1972    Years since quitting: 46.4  . Smokeless tobacco: Never Used  . Tobacco comment: Quit 40 years ago  Substance Use Topics  . Alcohol use: No  . Drug use: No     Allergies   Liraglutide; Niacin; Atorvastatin; Darvocet [propoxyphene n-acetaminophen]; Metformin and related; and Propoxyphene   Review of Systems Review of Systems  Constitutional: Negative for fever.  Cardiovascular: Negative for chest pain.  Gastrointestinal: Positive for abdominal pain, constipation, nausea and vomiting. Negative for blood in stool, diarrhea, hematemesis and hematochezia.  Genitourinary: Negative for dysuria.  All other systems reviewed and are negative.    Physical Exam Updated Vital Signs BP (!) 140/54   Pulse 74   Resp 15   Ht 1.575 m (5\' 2" )   Wt 77.1 kg    SpO2 100%   BMI 31.09 kg/m   Physical Exam CONSTITUTIONAL: Elderly, no acute distress HEAD: Normocephalic/atraumatic EYES: EOMI/PERRL ENMT: Mucous membranes moist NECK: supple no meningeal signs SPINE/BACK:entire spine nontender CV: S1/S2 noted LUNGS: Lungs are clear to auscultation bilaterally, no apparent distress ABDOMEN: soft, generalized mild abdominal tenderness, no rebound or guarding, bowel sounds noted throughout abdomen GU:no cva tenderness NEURO: Pt is awake/alert/appropriate, moves all extremitiesx4.  No facial droop.   EXTREMITIES:  full ROM SKIN: warm, color normal PSYCH: no abnormalities of mood noted, alert and oriented to situation   ED Treatments / Results  Labs (all labs ordered are listed, but only abnormal  results are displayed) Labs Reviewed  COMPREHENSIVE METABOLIC PANEL - Abnormal; Notable for the following components:      Result Value   Sodium 134 (*)    Glucose, Bld 110 (*)    All other components within normal limits  CBC WITH DIFFERENTIAL/PLATELET  LIPASE, BLOOD  URINALYSIS, ROUTINE W REFLEX MICROSCOPIC    EKG EKG Interpretation  Date/Time:  Monday September 08 2018 05:22:49 EST Ventricular Rate:  68 PR Interval:    QRS Duration: 95 QT Interval:  393 QTC Calculation: 418 R Axis:   21 Text Interpretation:  Sinus rhythm Prolonged PR interval Low voltage, precordial leads Baseline wander in lead(s) III aVL aVF V3 Confirmed by Zadie Rhine ([id number]) on 09/08/2018 5:34:19 AM   Radiology No results found.  Procedures Procedures  Medications Ordered in ED Medications  iopamidol (ISOVUE-300) 61 % injection 100 mL (has no administration in time range)  fentaNYL (SUBLIMAZE) injection 50 mcg (50 mcg Intravenous Given 09/08/18 0622)  ondansetron (ZOFRAN) injection 4 mg (4 mg Intravenous Given 09/08/18 0622)  ondansetron (ZOFRAN) injection 4 mg (4 mg Intravenous Given 09/08/18 6578)     Initial Impression / Assessment and Plan / ED Course  I  have reviewed the triage vital signs and the nursing notes.  Pertinent labs  results that were available during my care of the patient were reviewed by me and considered in my medical decision making (see chart for details).     7:08 AM Generalized abdominal pain for up to 3 days.  She reports that she had nausea/vomiting.  She is having nausea here.  Will obtain CT imaging Signed out to dr Deretha Emory to f/u on CT imaging   Final Clinical Impressions(s) / ED Diagnoses   Final diagnoses:  Generalized abdominal pain    ED Discharge Orders    None       Zadie Rhine, MD 09/08/18 [phone]

## 2019-06-26 ENCOUNTER — Other Ambulatory Visit: Payer: Self-pay

## 2019-06-26 ENCOUNTER — Ambulatory Visit
Admission: EM | Admit: 2019-06-26 | Discharge: 2019-06-26 | Disposition: A | Discharge status: Home / Self Care | Payer: Medicare HMO

## 2019-06-26 DIAGNOSIS — R059 Cough, unspecified: Secondary | ICD-10-CM

## 2019-06-26 DIAGNOSIS — R5381 Other malaise: Secondary | ICD-10-CM

## 2019-06-26 DIAGNOSIS — I10 Essential (primary) hypertension: Secondary | ICD-10-CM

## 2019-06-26 DIAGNOSIS — J0100 Acute maxillary sinusitis, unspecified: Secondary | ICD-10-CM

## 2019-06-26 DIAGNOSIS — J3089 Other allergic rhinitis: Secondary | ICD-10-CM

## 2019-06-26 DIAGNOSIS — R05 Cough: Secondary | ICD-10-CM

## 2019-06-26 LAB — POCT FASTING CBG KUC MANUAL ENTRY: POCT Glucose (KUC): 184 mg/dL — AB (ref 70–99)

## 2019-06-26 MED ORDER — BENZONATATE 100 MG PO CAPS: 100.0000 mg | ORAL_CAPSULE | Freq: Three times a day (TID) | ORAL | 0 refills | Status: AC | PRN

## 2019-06-26 MED ORDER — AMOXICILLIN 875 MG PO TABS: 875.0000 mg | ORAL_TABLET | Freq: Two times a day (BID) | ORAL | 0 refills | Status: DC

## 2019-06-26 NOTE — Discharge Instructions (Signed)
Hold off on using Flonase until you are better for at least 1 week. We are going to treat you for a sinus infection with amoxicillin. For now, use Zyrtec instead of Flonase while you clear your sinus infection.

## 2019-06-26 NOTE — ED Triage Notes (Signed)
Pt presents with c/o nausea chills and cold symptoms that began Monday

## 2019-06-26 NOTE — ED Provider Notes (Signed)
MRN: [id number] DOB: [birth year]  Subjective:   [name] is a 78 y.o. female presenting for 5-day history acute onset worsening malaise and fatigue, now having sinus pain and subjective fever.  Patient has a history of allergic rhinitis, uses Flonase consistently every day with good relief.  Patient is social distancing well. No known COVID 19 contacts.  She does have a history of diabetes type 2 with insulin, no history of heart disease.  She does have prescription for nitroglycerin because of a history of chest pain but has never been found to have an MI per patient.   No current facility-administered medications for this encounter.   Current Outpatient Medications:  .  amLODipine (NORVASC) 10 MG tablet, Take 10 mg by mouth daily., Disp: , Rfl:  .  allopurinol (ZYLOPRIM) 300 MG tablet, Take 300 mg by mouth daily., Disp: , Rfl:  .  cholecalciferol (VITAMIN D) 1000 UNITS tablet, Take 1,000 Units by mouth daily.  , Disp: , Rfl:  .  docusate sodium (COLACE) 100 MG capsule, Take 100 mg by mouth at bedtime., Disp: , Rfl:  .  fluticasone (FLONASE) 50 MCG/ACT nasal spray, Place 1-2 sprays into both nostrils daily., Disp: , Rfl:  .  hydrocortisone 1 % lotion, Apply 1 application topically 2 (two) times daily., Disp: 118 mL, Rfl: 0 .  insulin aspart (NOVOLOG) 100 UNIT/ML injection, Sliding scale CBG 70 - 120: 0 units CBG 121 - 150: 1 unit,  CBG 151 - 200: 2 units,  CBG 201 - 250: 3 units,  CBG 251 - 300: 5 units,  CBG 301 - 350: 7 units,  CBG 351 - 400: 9 units   CBG > 400: 9 units and notify your MD, Disp: 10 mL, Rfl: 3 .  Insulin Glargine (LANTUS) 100 UNIT/ML Solostar Pen, Inject 5 Units into the skin daily., Disp: 15 mL, Rfl: 11 .  metoprolol tartrate (LOPRESSOR) 50 MG tablet, Take 1 tablet (50 mg total) by mouth 2 (two) times daily., Disp: , Rfl:  .  nitroGLYCERIN (NITROSTAT) 0.4 MG SL tablet, Place 0.4 mg under the tongue as directed. Place one tablet under the tongue every 5 minutes as  needed for chest pain, Disp: , Rfl:  .  nystatin cream (MYCOSTATIN), Apply 1 application topically 2 (two) times daily. 100,000 unit/gram\ Apply  To affected area of back for 7 days Start: 11/28/17 Stop : 12/06/17, Disp: , Rfl:  .  ondansetron (ZOFRAN ODT) 4 MG disintegrating tablet, Take 1 tablet (4 mg total) by mouth every 8 (eight) hours as needed., Disp: 10 tablet, Rfl: 1 .  ondansetron (ZOFRAN) 4 MG tablet, Take 4 mg by mouth 3 (three) times daily as needed for nausea or vomiting. Give 4 mg  By mouth TID prn for nausea x 14 Start 11/27/17 Stop: 12/10/17, Disp: , Rfl:  .  pantoprazole (PROTONIX) 40 MG tablet, Take 40 mg by mouth daily., Disp: , Rfl:  .  promethazine (PHENERGAN) 25 MG tablet, Take 25 mg by mouth every 8 (eight) hours as needed for nausea or vomiting. N / V for 3 days  Start 11/28/17 Stop: 12/01/17, Disp: , Rfl:    Allergies  Allergen Reactions  . Liraglutide Nausea And Vomiting  . Niacin Nausea And Vomiting  . Atorvastatin Other (See Comments)    Myalgias  . Darvocet [Propoxyphene N-Acetaminophen] Nausea Only  . Metformin And Related Nausea Only  . Propoxyphene Nausea Only    Past Medical History:  Diagnosis Date  . Arthritis   .  Chest pain    Hospital 03/2012  // outpatient nuclear normal 04/02/2012  EF  80%  . Diabetes mellitus   . GERD (gastroesophageal reflux disease)   . Hyperlipidemia   . Hypertension   . Obesity   . Stress at home      Past Surgical History:  Procedure Laterality Date  . ABDOMINAL HYSTERECTOMY    . ESOPHAGOGASTRODUODENOSCOPY N/A 03/07/2015   Procedure: ESOPHAGOGASTRODUODENOSCOPY (EGD);  Surgeon: Garlan Fair, MD;  Location: [facility];  Service: Endoscopy;  Laterality: N/A;  NO PROPOFOL  . ORIF ANKLE FRACTURE Right 10/27/2017   Procedure: OPEN REDUCTION INTERNAL FIXATION (ORIF) ANKLE FRACTURE;  Surgeon: Dorna Leitz, MD;  Location: [place];  Service: Orthopedics;  Laterality: Right;  . SHOULDER SURGERY      Social History    Tobacco Use  . Smoking status: Former Smoker    Packs/day: 0.30    Years: 0.00    Pack years: 0.00    Types: Cigarettes    Quit date: 03/30/1972    Years since quitting: 47.2  . Smokeless tobacco: Never Used  . Tobacco comment: Quit 40 years ago  Substance Use Topics  . Alcohol use: No  . Drug use: No   Family History  Problem Relation Age of Onset  . Heart failure Mother   . Heart attack Father      Review of Systems  Constitutional: Positive for fever and malaise/fatigue.  HENT: Positive for congestion and sinus pain. Negative for ear pain and sore throat.   Eyes: Negative for discharge and redness.  Respiratory: Positive for cough. Negative for hemoptysis, shortness of breath and wheezing.   Cardiovascular: Negative for chest pain.  Gastrointestinal: Negative for abdominal pain, diarrhea, nausea and vomiting.  Genitourinary: Negative for dysuria, flank pain and hematuria.  Musculoskeletal: Positive for joint pain (chronic). Negative for myalgias.  Skin: Negative for rash.  Neurological: Negative for dizziness, weakness and headaches.  Psychiatric/Behavioral: Negative for depression and substance abuse.    Objective:   Vitals: BP (!) 185/81   Pulse 97   Temp 99.5 F (37.5 C)   Resp 18   SpO2 94%   BP Readings from Last 3 Encounters:  06/26/19 (!) 185/81  09/08/18 (!) 140/54  12/02/17 (!) 149/70   Her BP was 169/77 on recheck by PA-Hussam Muniz at 18:38.  Physical Exam Constitutional:      General: She is not in acute distress.    Appearance: Normal appearance. She is well-developed. She is not ill-appearing, toxic-appearing or diaphoretic.  HENT:     Head: Normocephalic and atraumatic.     Right Ear: Tympanic membrane and ear canal normal. No drainage or tenderness. No middle ear effusion. Tympanic membrane is not erythematous.     Left Ear: Tympanic membrane and ear canal normal. No drainage or tenderness.  No middle ear effusion. Tympanic membrane is not  erythematous.     Nose: Congestion present. No rhinorrhea.     Comments: Bilateral maxillary sinus tenderness.    Mouth/Throat:     Mouth: Mucous membranes are moist. No oral lesions.     Pharynx: No pharyngeal swelling, oropharyngeal exudate, posterior oropharyngeal erythema or uvula swelling.     Tonsils: No tonsillar exudate or tonsillar abscesses.     Comments: Significant postnasal drainage in oropharynx. Eyes:     General: No scleral icterus.       Right eye: No discharge.        Left eye: No discharge.     Extraocular Movements:  Extraocular movements intact.     Right eye: Normal extraocular motion.     Left eye: Normal extraocular motion.     Pupils: Pupils are equal, round, and reactive to light.  Neck:     Musculoskeletal: Normal range of motion and neck supple.  Cardiovascular:     Rate and Rhythm: Normal rate and regular rhythm.     Pulses: Normal pulses.     Heart sounds: Normal heart sounds. No murmur. No friction rub. No gallop.   Pulmonary:     Effort: Pulmonary effort is normal. No respiratory distress.     Breath sounds: Normal breath sounds. No stridor. No wheezing, rhonchi or rales.  Lymphadenopathy:     Cervical: No cervical adenopathy.  Skin:    General: Skin is warm and dry.     Findings: No rash.  Neurological:     General: No focal deficit present.     Mental Status: She is alert and oriented to person, place, and time.     Cranial Nerves: No cranial nerve deficit.     Motor: Weakness (Not new, has ambulated and rolling walker) present.     Coordination: Coordination abnormal (Has to ambulate in rolling walker).  Psychiatric:        Mood and Affect: Mood normal.        Behavior: Behavior normal.        Thought Content: Thought content normal.      Assessment and Plan :   1. Acute non-recurrent maxillary sinusitis   2. Allergic rhinitis due to other allergic trigger, unspecified seasonality   3. Malaise and fatigue   4. Cough   5. Essential  hypertension   6. Elevated blood pressure reading in office with diagnosis of hypertension     Will cover for maxillary sinusitis with amoxicillin.  Patient advised to hold off on using Flonase and switch to Zyrtec until she clears her sinus infection.  Will use supportive care otherwise.  COVID-19 testing pending.  Regarding her blood pressure, recheck was improved, patient states that she has yet to take her nighttime dose of blood pressure medication.  She is to monitor her blood pressure and follow-up with PCP if it remains elevated.  Strict ER precautions. Counseled patient on potential for adverse effects with medications prescribed/recommended today, ER and return-to-clinic precautions discussed, patient verbalized understanding.    Wallis Bamberg, PA-C 06/26/19 1900

## 2019-06-30 ENCOUNTER — Telehealth (HOSPITAL_COMMUNITY): Payer: Self-pay | Admitting: Emergency Medicine

## 2019-06-30 LAB — NOVEL CORONAVIRUS, NAA: SARS-CoV-2, NAA: DETECTED — AB

## 2019-06-30 NOTE — Telephone Encounter (Signed)
Positive Covid detected. Pt contacted and made aware, discussed quarantine times with patient. Pt verbalized understanding, all questions answered.

## 2020-03-16 ENCOUNTER — Other Ambulatory Visit: Payer: Self-pay

## 2020-03-16 ENCOUNTER — Encounter: Payer: Self-pay | Admitting: Skilled Nursing Facility1

## 2020-03-16 ENCOUNTER — Encounter: Payer: Medicare HMO | Attending: Nurse Practitioner | Admitting: Skilled Nursing Facility1

## 2020-03-16 DIAGNOSIS — E119 Type 2 diabetes mellitus without complications: Secondary | ICD-10-CM | POA: Insufficient documentation

## 2020-03-16 NOTE — Progress Notes (Signed)
Diabetes Self-Management Education  Visit Type: First/Initial  03/16/2020  [name], identified by name and date of birth, is a 79 y.o. female with a diagnosis of Diabetes: Type 2.   ASSESSMENT  There were no vitals taken for this visit. There is no height or weight on file to calculate BMI.   Pt was lucid and demonstrated comprehension through teach back.  Pt states she has lost 12 pounds in about 2 months.  Pt states she stopped eating sugar and drinking sugary drinks. Pt states she takes her insulin every day at the dame time injecting in her side. Pt states she takes her pills on a full stomach so it does not hurt.  Pt states she likes fish. Pt states she has reflux daily and chokes on her food sometimes not every day 2 cups of coffee in the morning  Pt state she does not eat chicken .  Pt states she is not sure how much she throws up.  Pt states she Eats out often. Pt sates she reads the nutrition facts label looking for low sugar foods.  Pt states her blood sugar was 92 in the morning. Pt is hypoglycemic aware happening about 1 time a week.  Pt states she adds splenda to her foods.  Pt states she talks to her family often.  Pt states her friend said insulin is bad in her body: Dietitian educated pt on insulin being neccessary for her blood sugar control.   Goals: Try Glucerna for breakfast  Throw your needles away in your used laundry detergent bottle and when it is filled screw the top back on and throw away NEVER use the same needle more than once for your insulin needle or your pricking needle Going longer than 5 hours without eating will cause a low blood sugar: eat crackers or a Glucerna if you do not want meal  Continue to take your insulin  Be sure to eat throughout the day   Diabetes Self-Management Education - 03/16/20 1103      Visit Information   Visit Type First/Initial      Initial Visit   Diabetes Type Type 2    Are you currently following  a meal plan? No    Are you taking your medications as prescribed? Yes      Health Coping   How would you rate your overall health? Good      Psychosocial Assessment   Patient Belief/Attitude about Diabetes Motivated to manage diabetes    Self-care barriers None    Patient Concerns Nutrition/Meal planning    Learning Readiness Change in progress      Pre-Education Assessment   Patient understands the diabetes disease and treatment process. Needs Instruction    Patient understands incorporating nutritional management into lifestyle. Needs Instruction    Patient undertands incorporating physical activity into lifestyle. Needs Instruction    Patient understands using medications safely. Needs Instruction    Patient understands monitoring blood glucose, interpreting and using results Needs Instruction    Patient understands prevention, detection, and treatment of acute complications. Needs Instruction    Patient understands prevention, detection, and treatment of chronic complications. Needs Instruction    Patient understands how to develop strategies to address psychosocial issues. Needs Instruction    Patient understands how to develop strategies to promote health/change behavior. Needs Instruction      Complications   How often do you check your blood sugar? 1-2 times/day    Fasting Blood glucose range (mg/dL) 08-676  Number of hypoglycemic episodes per month 4    Can you tell when your blood sugar is low? Yes    Number of hyperglycemic episodes per week 0    Are you checking your feet? Yes    How many days per week are you checking your feet? 7      Dietary Intake   Breakfast oatmeal    Lunch Bacon and tomatoes on brown bread    Dinner Fish and broccoli      Exercise   Exercise Type ADL's      Patient Education   Previous Diabetes Education No    Acute complications Taught treatment of hypoglycemia - the 15 rule.      Individualized Goals (developed by patient)    Nutrition General guidelines for healthy choices and portions discussed;Follow meal plan discussed      Post-Education Assessment   Patient understands the diabetes disease and treatment process. Demonstrates understanding / competency    Patient understands incorporating nutritional management into lifestyle. Demonstrates understanding / competency    Patient undertands incorporating physical activity into lifestyle. Demonstrates understanding / competency    Patient understands using medications safely. Demonstrates understanding / competency    Patient understands monitoring blood glucose, interpreting and using results Demonstrates understanding / competency    Patient understands prevention, detection, and treatment of acute complications. Demonstrates understanding / competency    Patient understands prevention, detection, and treatment of chronic complications. Demonstrates understanding / competency    Patient understands how to develop strategies to address psychosocial issues. Demonstrates understanding / competency    Patient understands how to develop strategies to promote health/change behavior. Demonstrates understanding / competency      Outcomes   Expected Outcomes Demonstrated interest in learning. Expect positive outcomes    Future DMSE PRN    Program Status Completed           Individualized Plan for Diabetes Self-Management Training:   Learning Objective:  Patient will have a greater understanding of diabetes self-management. Patient education plan is to attend individual and/or group sessions per assessed needs and concerns.   Plan:   There are no Patient Instructions on file for this visit.  Expected Outcomes:  Demonstrated interest in learning. Expect positive outcomes  Education material provided: My Plate  If problems or questions, patient to contact team via:  Phone  Future DSME appointment: PRN

## 2020-08-21 ENCOUNTER — Encounter: Payer: Self-pay | Admitting: Emergency Medicine

## 2020-08-21 ENCOUNTER — Ambulatory Visit
Admission: EM | Admit: 2020-08-21 | Discharge: 2020-08-21 | Disposition: A | Discharge status: Home / Self Care | Payer: Medicare HMO | Attending: Family Medicine | Admitting: Family Medicine

## 2020-08-21 ENCOUNTER — Other Ambulatory Visit: Payer: Self-pay

## 2020-08-21 DIAGNOSIS — R059 Cough, unspecified: Secondary | ICD-10-CM

## 2020-08-21 DIAGNOSIS — J22 Unspecified acute lower respiratory infection: Secondary | ICD-10-CM

## 2020-08-21 MED ORDER — AMOXICILLIN-POT CLAVULANATE 875-125 MG PO TABS: 1.0000 | ORAL_TABLET | Freq: Two times a day (BID) | ORAL | 0 refills | Status: AC

## 2020-08-21 NOTE — Discharge Instructions (Signed)
Take the Augmentin 2 times a day Drink plenty of fluids Take Mucinex DM for the congestion Follow-up with your primary care doctor

## 2020-08-21 NOTE — ED Triage Notes (Signed)
Patient states that she has sinus congestion, sore throat x 3 days- mucinex isnt helping.

## 2020-08-22 NOTE — ED Provider Notes (Signed)
[place]    CSN: [id number] Arrival date & time: 08/21/20  1334      History   Chief Complaint Chief Complaint  Patient presents with  . Nasal Congestion    HPI [name] is a 80 y.o. female.   HPI  Patient is here with her 2 daughters.  Both daughters have upper respiratory infections with cough and "sinus".  This patient has been sick for 3 or 4 days.  She states she is feeling very tired.  She states that she is feeling "weak".  She states that she has heart disease diabetes and high blood pressure.  She states she does have underlying lung disease.  She states that when she catches an infection she "always" needs an antibiotic.  No known Covid exposure.  She is not Covid vaccinated  Past Medical History:  Diagnosis Date  . Arthritis   . Chest pain    Hospital 03/2012  // outpatient nuclear normal 04/02/2012  EF  80%  . Diabetes mellitus   . GERD (gastroesophageal reflux disease)   . Hyperlipidemia   . Hypertension   . Obesity   . Stress at home     Patient Active Problem List   Diagnosis Date Noted  . Labile blood pressure   . Acute blood loss anemia   . AKI (acute kidney injury) ([place]) 11/29/2017  . Displaced trimalleolar fracture of right ankle 10/27/2017  . BMI 32.0-32.9,adult 04/02/2016  . Skin candidiasis 04/02/2016  . Chest pain   . DM (diabetes mellitus), type 2, uncontrolled with complications ([place]) 89/21/1942  . Hypertension   . GERD (gastroesophageal reflux disease)   . Hyperlipidemia   . Arthritis     Past Surgical History:  Procedure Laterality Date  . ABDOMINAL HYSTERECTOMY    . ESOPHAGOGASTRODUODENOSCOPY N/A 03/07/2015   Procedure: ESOPHAGOGASTRODUODENOSCOPY (EGD);  Surgeon: Garlan Fair, MD;  Location: [facility];  Service: Endoscopy;  Laterality: N/A;  NO PROPOFOL  . ORIF ANKLE FRACTURE Right 10/27/2017   Procedure: OPEN REDUCTION INTERNAL FIXATION (ORIF) ANKLE FRACTURE;  Surgeon: Dorna Leitz, MD;  Location: [facility];   Service: Orthopedics;  Laterality: Right;  . SHOULDER SURGERY      OB History   No obstetric history on file.      Home Medications    Prior to Admission medications   Medication Sig Start Date End Date Taking? Authorizing Provider  amoxicillin-clavulanate (AUGMENTIN) 875-125 MG tablet Take 1 tablet by mouth every 12 (twelve) hours. 08/21/20  Yes Raylene Everts, MD  allopurinol (ZYLOPRIM) 300 MG tablet Take 300 mg by mouth daily.    [provider]  amLODipine (NORVASC) 10 MG tablet Take 10 mg by mouth daily. 02/25/19   [provider]  benzonatate (TESSALON) 100 MG capsule Take 1-2 capsules (100-200 mg total) by mouth 3 (three) times daily as needed for cough. 06/26/19   Jaynee Eagles, PA-C  cholecalciferol (VITAMIN D) 1000 UNITS tablet Take 1,000 Units by mouth daily.      [provider]  docusate sodium (COLACE) 100 MG capsule Take 100 mg by mouth at bedtime.    [provider]  fluticasone (FLONASE) 50 MCG/ACT nasal spray Place 1-2 sprays into both nostrils daily. 08/29/17   [provider]  hydrocortisone 1 % lotion Apply 1 application topically 2 (two) times daily. 04/02/16   Eustaquio Maize, MD  insulin aspart (NOVOLOG) 100 UNIT/ML injection Sliding scale CBG 70 - 120: 0 units CBG 121 - 150: 1 unit,  CBG 151 - 200: 2 units,  CBG 201 - 250: 3 units,  CBG 251 - 300: 5 units,  CBG 301 - 350: 7 units,  CBG 351 - 400: 9 units   CBG > 400: 9 units and notify your MD 12/02/17 12/02/18  Rai, Delene Ruffini, MD  Insulin Glargine (LANTUS) 100 UNIT/ML Solostar Pen Inject 5 Units into the skin daily. 12/02/17   Rai, Delene Ruffini, MD  metoprolol tartrate (LOPRESSOR) 50 MG tablet Take 1 tablet (50 mg total) by mouth 2 (two) times daily. 12/02/17   Rai, Delene Ruffini, MD  nitroGLYCERIN (NITROSTAT) 0.4 MG SL tablet Place 0.4 mg under the tongue as directed. Place one tablet under the tongue every 5 minutes as needed for chest pain 03/22/15   [provider]   nystatin cream (MYCOSTATIN) Apply 1 application topically 2 (two) times daily. 100,000 unit/gram\ Apply  To affected area of back for 7 days Start: 11/28/17 Stop : 12/06/17    [provider]  ondansetron (ZOFRAN ODT) 4 MG disintegrating tablet Take 1 tablet (4 mg total) by mouth every 8 (eight) hours as needed. 09/08/18   Vanetta Mulders, MD  ondansetron (ZOFRAN) 4 MG tablet Take 4 mg by mouth 3 (three) times daily as needed for nausea or vomiting. Give 4 mg  By mouth TID prn for nausea x 14 Start 11/27/17 Stop: 12/10/17    [provider]  pantoprazole (PROTONIX) 40 MG tablet Take 40 mg by mouth daily. 08/03/15   [provider]    Family History Family History  Problem Relation Age of Onset  . Heart failure Mother   . Heart attack Father     Social History Social History   Tobacco Use  . Smoking status: Former Smoker    Packs/day: 0.30    Years: 0.00    Pack years: 0.00    Types: Cigarettes    Quit date: 03/30/1972    Years since quitting: 48.4  . Smokeless tobacco: Never Used  . Tobacco comment: Quit 40 years ago  Vaping Use  . Vaping Use: Never used  Substance Use Topics  . Alcohol use: No  . Drug use: No     Allergies   Liraglutide, Niacin, Atorvastatin, Darvocet [propoxyphene n-acetaminophen], Metformin and related, and Propoxyphene   Review of Systems Review of Systems See HPI  Physical Exam Triage Vital Signs ED Triage Vitals  Enc Vitals Group     BP 08/21/20 1446 (!) 178/75     Pulse Rate 08/21/20 1446 82     Resp 08/21/20 1446 16     Temp 08/21/20 1446 98.1 F (36.7 C)     Temp src --      SpO2 08/21/20 1446 98 %     Weight 08/21/20 1511 175 lb (79.4 kg)     Height 08/21/20 1511 5\' 2"  (1.575 m)     Head Circumference --      Peak Flow --      Pain Score 08/21/20 1509 0     Pain Loc --      Pain Edu? --      Excl. in GC? --    No data found.  Updated Vital Signs BP (!) 178/75 Comment: pt has not taken her bp  meds today   Pulse 82   Temp 98.1 F (36.7 C)   Resp 16   Ht 5\' 2"  (1.575 m)   Wt 79.4 kg   SpO2 98%   BMI 32.01 kg/m  Physical Exam Constitutional:      General: She is not in acute distress.    Appearance: She is well-developed and well-nourished.     Comments: Appears tired.  Mildly ill  HENT:     Head: Normocephalic and atraumatic.     Nose: Congestion present.     Mouth/Throat:     Pharynx: No posterior oropharyngeal erythema.  Eyes:     Conjunctiva/sclera: Conjunctivae normal.     Pupils: Pupils are equal, round, and reactive to light.  Cardiovascular:     Rate and Rhythm: Normal rate and regular rhythm.     Heart sounds: Normal heart sounds.  Pulmonary:     Effort: Pulmonary effort is normal. No respiratory distress.     Breath sounds: Normal breath sounds.  Abdominal:     General: There is no distension.     Palpations: Abdomen is soft.  Musculoskeletal:        General: No edema. Normal range of motion.     Cervical back: Normal range of motion.  Skin:    General: Skin is warm and dry.  Neurological:     Mental Status: She is alert.  Psychiatric:        Behavior: Behavior normal.      UC Treatments / Results  Labs (all labs ordered are listed, but only abnormal results are displayed) Labs Reviewed  COVID-19, FLU A+B NAA    EKG   Radiology No results found.  Procedures Procedures (including critical care time)  Medications Ordered in UC Medications - No data to display  Initial Impression / Assessment and Plan / UC Course  I have reviewed the triage vital signs and the nursing notes.  Pertinent labs & imaging results that were available during my care of the patient were reviewed by me and considered in my medical decision making (see chart for details).     Patient likely has a viral upper respiratory infection.  Because her 2 daughters are ill I do have concern there may be Covid.  Patient agrees to Covid testing.  I am will cover  her with antibiotics simply because of her comorbid illnessAnd past history.  She should follow-up with her primary care doctor Final Clinical Impressions(s) / UC Diagnoses   Final diagnoses:  Cough  LRTI (lower respiratory tract infection)     Discharge Instructions     Take the Augmentin 2 times a day Drink plenty of fluids Take Mucinex DM for the congestion Follow-up with your primary care doctor   ED Prescriptions    Medication Sig Dispense Auth. Provider   amoxicillin-clavulanate (AUGMENTIN) 875-125 MG tablet Take 1 tablet by mouth every 12 (twelve) hours. 14 tablet Eustace Moore, MD     PDMP not reviewed this encounter.   Eustace Moore, MD 08/22/20 [phone]

## 2020-08-24 LAB — COVID-19, FLU A+B NAA
Influenza A, NAA: NOT DETECTED
Influenza B, NAA: NOT DETECTED
SARS-CoV-2, NAA: NOT DETECTED

## 2022-02-02 ENCOUNTER — Ambulatory Visit: Payer: Self-pay

## 2022-02-02 NOTE — Telephone Encounter (Signed)
  Chief Complaint: Itching - widespread on body Symptoms: itching in patches all over body Frequency: 20 years Pertinent Negatives: Patient denies  Disposition: [] ED /[] Urgent Care (no appt availability in office) / [] Appointment(In office/virtual)/ []  [facility]/ [] Home Care/ [] Refused Recommended Disposition /[] [facility]/ [x]  Follow-up with PCP Additional Notes: Pt states that she has had this itching for 20 years and PCP has not addressed this issue. Pt also states that she has some medicine (Cream) that she was given for itching. Pt states she has tried antihistamines with little improvement. Pt will call pcp today for appt. If unavailable she will go to UC.   Summary: Itching all over Advice   Pt is calling to ask is it normal to itch all over her body. Pt report scratching every where. Pt reports that she has told her PCP and receive no assistance.      Reason for Disposition  [1] Widespread itching AND [2] cause unknown AND [3] present > 48 hours (Exception: caller knows the cause and can eliminate it)  Answer Assessment - Initial Assessment Questions 1. DESCRIPTION: "Describe the itching you are having."     Spots on body  2. SEVERITY: "How bad is it?"    - MILD - doesn't interfere with normal activities   - MODERATE-SEVERE: interferes with work, school, sleep, or other activities      moderate 3. SCRATCHING: "Are there any scratch marks? Bleeding?"     Yes - yes 4. ONSET: "When did this begin?"      20 years 5. CAUSE: "What do you think is causing the itching?" (ask about swimming pools, pollen, animals, soaps, etc.)     Old age. 6. OTHER SYMPTOMS: "Do you have any other symptoms?"      no 7. PREGNANCY: "Is there any chance you are pregnant?" "When was your last menstrual period?"     no  Protocols used: Itching - [facility]
